# Patient Record
Sex: Male | Born: 1960 | Race: Black or African American | Hispanic: No | Marital: Married | Smoking: Former smoker
Health system: Southern US, Community
[De-identification: ages and names within clinical notes are randomized; demographics above are authoritative.]

## PROBLEM LIST (undated history)

## (undated) DIAGNOSIS — M1990 Unspecified osteoarthritis, unspecified site: Secondary | ICD-10-CM

## (undated) DIAGNOSIS — N189 Chronic kidney disease, unspecified: Secondary | ICD-10-CM

## (undated) DIAGNOSIS — N400 Enlarged prostate without lower urinary tract symptoms: Secondary | ICD-10-CM

## (undated) DIAGNOSIS — F32A Depression, unspecified: Secondary | ICD-10-CM

## (undated) DIAGNOSIS — I10 Essential (primary) hypertension: Secondary | ICD-10-CM

## (undated) DIAGNOSIS — F329 Major depressive disorder, single episode, unspecified: Secondary | ICD-10-CM

## (undated) HISTORY — DX: Unspecified osteoarthritis, unspecified site: M19.90

## (undated) HISTORY — DX: Essential (primary) hypertension: I10

## (undated) HISTORY — DX: Depression, unspecified: F32.A

## (undated) HISTORY — DX: Chronic kidney disease, unspecified: N18.9

## (undated) HISTORY — PX: CYST EXCISION: SHX5701

## (undated) HISTORY — PX: AV FISTULA PLACEMENT: SHX1204

## (undated) HISTORY — DX: Major depressive disorder, single episode, unspecified: F32.9

---

## 2000-01-10 ENCOUNTER — Emergency Department (HOSPITAL_COMMUNITY): Admission: EM | Admit: 2000-01-10 | Discharge: 2000-01-10 | Payer: Self-pay | Admitting: Emergency Medicine

## 2009-04-03 ENCOUNTER — Emergency Department: Payer: Self-pay | Admitting: Emergency Medicine

## 2010-11-22 LAB — HEMOGLOBIN A1C: Hgb A1c MFr Bld: 5.5 % (ref 4.0–6.0)

## 2011-12-12 DIAGNOSIS — L738 Other specified follicular disorders: Secondary | ICD-10-CM | POA: Diagnosis not present

## 2011-12-24 DIAGNOSIS — L708 Other acne: Secondary | ICD-10-CM | POA: Diagnosis not present

## 2011-12-24 DIAGNOSIS — L905 Scar conditions and fibrosis of skin: Secondary | ICD-10-CM | POA: Diagnosis not present

## 2012-03-08 LAB — TSH: TSH: 2.19 u[IU]/mL (ref <=5.90)

## 2012-09-02 DIAGNOSIS — M2550 Pain in unspecified joint: Secondary | ICD-10-CM | POA: Diagnosis not present

## 2012-10-22 ENCOUNTER — Ambulatory Visit: Payer: Self-pay | Admitting: Anesthesiology

## 2012-10-22 DIAGNOSIS — Z01810 Encounter for preprocedural cardiovascular examination: Secondary | ICD-10-CM | POA: Diagnosis not present

## 2012-10-22 DIAGNOSIS — Z01812 Encounter for preprocedural laboratory examination: Secondary | ICD-10-CM | POA: Diagnosis not present

## 2012-10-22 DIAGNOSIS — I10 Essential (primary) hypertension: Secondary | ICD-10-CM | POA: Diagnosis not present

## 2012-10-22 LAB — BASIC METABOLIC PANEL
Anion Gap: 4 — ABNORMAL LOW (ref 7–16)
BUN: 12 mg/dL (ref 7–18)
Chloride: 106 mmol/L (ref 98–107)
Creatinine: 1.9 mg/dL — ABNORMAL HIGH (ref 0.60–1.30)
EGFR (African American): 46 — ABNORMAL LOW
EGFR (Non-African Amer.): 40 — ABNORMAL LOW
Glucose: 89 mg/dL (ref 65–99)
Osmolality: 279 (ref 275–301)

## 2012-10-27 ENCOUNTER — Ambulatory Visit: Payer: Self-pay | Admitting: Orthopedic Surgery

## 2012-10-27 DIAGNOSIS — I10 Essential (primary) hypertension: Secondary | ICD-10-CM | POA: Diagnosis not present

## 2012-10-27 DIAGNOSIS — M6740 Ganglion, unspecified site: Secondary | ICD-10-CM | POA: Diagnosis not present

## 2012-10-27 DIAGNOSIS — Z87891 Personal history of nicotine dependence: Secondary | ICD-10-CM | POA: Diagnosis not present

## 2012-10-27 DIAGNOSIS — N289 Disorder of kidney and ureter, unspecified: Secondary | ICD-10-CM | POA: Diagnosis not present

## 2012-10-27 DIAGNOSIS — F319 Bipolar disorder, unspecified: Secondary | ICD-10-CM | POA: Diagnosis not present

## 2012-10-27 DIAGNOSIS — D1610 Benign neoplasm of short bones of unspecified upper limb: Secondary | ICD-10-CM | POA: Diagnosis not present

## 2012-10-27 DIAGNOSIS — Z79899 Other long term (current) drug therapy: Secondary | ICD-10-CM | POA: Diagnosis not present

## 2012-10-27 DIAGNOSIS — M6749 Ganglion, multiple sites: Secondary | ICD-10-CM | POA: Diagnosis not present

## 2012-10-27 DIAGNOSIS — M19019 Primary osteoarthritis, unspecified shoulder: Secondary | ICD-10-CM | POA: Diagnosis not present

## 2012-12-03 LAB — LIPID PANEL
Cholesterol: 217 mg/dL — AB (ref 0–200)
HDL: 41 mg/dL (ref 35–70)
LDL CALC: 136 mg/dL
Triglycerides: 201 mg/dL — AB (ref 40–160)

## 2013-04-01 DIAGNOSIS — M6740 Ganglion, unspecified site: Secondary | ICD-10-CM | POA: Diagnosis not present

## 2013-06-18 LAB — BASIC METABOLIC PANEL
BUN: 16 mg/dL (ref 4–21)
Creatinine: 2.1 mg/dL — AB (ref 0.6–1.3)
GLUCOSE: 102 mg/dL
POTASSIUM: 3.3 mmol/L — AB (ref 3.4–5.3)
SODIUM: 140 mmol/L (ref 137–147)

## 2013-08-13 LAB — CBC AND DIFFERENTIAL
HEMATOCRIT: 35 % — AB (ref 41–53)
HEMOGLOBIN: 12.7 g/dL — AB (ref 13.5–17.5)
Platelets: 167 10*3/uL (ref 150–399)
WBC: 7.3 10^3/mL

## 2013-08-13 LAB — BASIC METABOLIC PANEL
BUN: 15 mg/dL (ref 4–21)
Creatinine: 2.2 mg/dL — AB (ref 0.6–1.3)
Glucose: 95 mg/dL
Potassium: 3 mmol/L — AB (ref 3.4–5.3)
Sodium: 138 mmol/L (ref 137–147)

## 2013-08-26 DIAGNOSIS — J309 Allergic rhinitis, unspecified: Secondary | ICD-10-CM | POA: Diagnosis not present

## 2013-10-26 DIAGNOSIS — J019 Acute sinusitis, unspecified: Secondary | ICD-10-CM | POA: Diagnosis not present

## 2013-10-26 DIAGNOSIS — R05 Cough: Secondary | ICD-10-CM | POA: Diagnosis not present

## 2013-10-26 DIAGNOSIS — R059 Cough, unspecified: Secondary | ICD-10-CM | POA: Diagnosis not present

## 2013-11-03 ENCOUNTER — Emergency Department: Payer: Self-pay | Admitting: Emergency Medicine

## 2013-11-03 DIAGNOSIS — J189 Pneumonia, unspecified organism: Secondary | ICD-10-CM | POA: Diagnosis not present

## 2013-11-03 DIAGNOSIS — Z79899 Other long term (current) drug therapy: Secondary | ICD-10-CM | POA: Diagnosis not present

## 2013-11-03 DIAGNOSIS — F319 Bipolar disorder, unspecified: Secondary | ICD-10-CM | POA: Diagnosis not present

## 2013-11-03 DIAGNOSIS — I10 Essential (primary) hypertension: Secondary | ICD-10-CM | POA: Diagnosis not present

## 2013-11-06 HISTORY — PX: CYST EXCISION: SHX5701

## 2014-01-14 LAB — BASIC METABOLIC PANEL
BUN: 20 mg/dL (ref 4–21)
CREATININE: 2.2 mg/dL — AB (ref 0.6–1.3)
GLUCOSE: 67 mg/dL
POTASSIUM: 3.5 mmol/L (ref 3.4–5.3)
Sodium: 141 mmol/L (ref 137–147)

## 2014-01-14 LAB — CBC AND DIFFERENTIAL
HCT: 33 % — AB (ref 41–53)
Hemoglobin: 11.9 g/dL — AB (ref 13.5–17.5)
Platelets: 178 10*3/uL (ref 150–399)
WBC: 7.7 10*3/mL

## 2014-01-31 ENCOUNTER — Encounter: Payer: Self-pay | Admitting: Internal Medicine

## 2014-01-31 ENCOUNTER — Encounter (INDEPENDENT_AMBULATORY_CARE_PROVIDER_SITE_OTHER): Payer: Self-pay

## 2014-01-31 ENCOUNTER — Ambulatory Visit (INDEPENDENT_AMBULATORY_CARE_PROVIDER_SITE_OTHER): Payer: Medicare Other | Admitting: Internal Medicine

## 2014-01-31 VITALS — BP 126/76 | HR 52 | Temp 98.3°F | Resp 16 | Ht 67.75 in | Wt 196.2 lb

## 2014-01-31 DIAGNOSIS — I10 Essential (primary) hypertension: Secondary | ICD-10-CM | POA: Diagnosis not present

## 2014-01-31 DIAGNOSIS — F3289 Other specified depressive episodes: Secondary | ICD-10-CM

## 2014-01-31 DIAGNOSIS — M1990 Unspecified osteoarthritis, unspecified site: Secondary | ICD-10-CM

## 2014-01-31 DIAGNOSIS — M129 Arthropathy, unspecified: Secondary | ICD-10-CM

## 2014-01-31 DIAGNOSIS — F32A Depression, unspecified: Secondary | ICD-10-CM

## 2014-01-31 DIAGNOSIS — F329 Major depressive disorder, single episode, unspecified: Secondary | ICD-10-CM | POA: Diagnosis not present

## 2014-01-31 NOTE — Progress Notes (Signed)
Pre visit review using our clinic review tool, if applicable. No additional management support is needed unless otherwise documented below in the visit note. 

## 2014-02-01 ENCOUNTER — Telehealth: Payer: Self-pay | Admitting: Internal Medicine

## 2014-02-01 NOTE — Telephone Encounter (Signed)
The patient left a message to add fish oil to his medication list.

## 2014-02-02 NOTE — Telephone Encounter (Signed)
Updated med list

## 2014-02-07 ENCOUNTER — Encounter: Payer: Self-pay | Admitting: Internal Medicine

## 2014-02-07 DIAGNOSIS — F329 Major depressive disorder, single episode, unspecified: Secondary | ICD-10-CM | POA: Insufficient documentation

## 2014-02-07 DIAGNOSIS — F32A Depression, unspecified: Secondary | ICD-10-CM | POA: Insufficient documentation

## 2014-02-07 DIAGNOSIS — M1990 Unspecified osteoarthritis, unspecified site: Secondary | ICD-10-CM | POA: Insufficient documentation

## 2014-02-07 DIAGNOSIS — I10 Essential (primary) hypertension: Secondary | ICD-10-CM | POA: Insufficient documentation

## 2014-02-07 DIAGNOSIS — F320 Major depressive disorder, single episode, mild: Secondary | ICD-10-CM | POA: Insufficient documentation

## 2014-02-07 NOTE — Assessment & Plan Note (Signed)
Sees psychiatry.  On Lithium and olanzapine.  Doing well on this regimen.  Follow.

## 2014-02-07 NOTE — Assessment & Plan Note (Signed)
Blood pressure under good control on current regimen.  Obtain labs from outside.  Continue same medication regimen.  Follow.

## 2014-02-07 NOTE — Progress Notes (Signed)
   Subjective:    Patient ID: Donald Small, male    DOB: 1961-08-18, 53 y.o.   MRN: [id number]  HPI 53 year old male with past history of arthritis, depression and hypertension who comes in today to follow up on these issues as well as to establish care.  Is followed at the [place].  Is seen there every six months.  He is also followed by psychiatry for his depression.  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  Also hospitalized in 2001.  Is on Lithium and olanzapine now and doing well.  Takes his medication regularly.  States blood pressure is controlled on his current regimen.  Denies and cardiac symptoms with increased activity or exertion.  No sob.  Eating and drinking well.  Bowels stable.  Overall he feels he is doing well.     Past Medical History  Diagnosis Date  . Arthritis   . Depression   . Hypertension     Outpatient Encounter Prescriptions as of 01/31/2014  Medication Sig  . amLODipine (NORVASC) 10 MG tablet Take 10 mg by mouth daily.  . carvedilol (COREG) 12.5 MG tablet Take 12.5 mg by mouth 2 (two) times daily with a meal.  . cholecalciferol (VITAMIN D) 1000 UNITS tablet Take 1,000 Units by mouth daily.  . hydrochlorothiazide (HYDRODIURIL) 25 MG tablet Take 25 mg by mouth every other day.  . lithium carbonate 300 MG capsule Take 900 mg by mouth at bedtime.  Marland Kitchen OLANZapine (ZYPREXA) 10 MG tablet Take 10 mg by mouth at bedtime.    Review of Systems Patient denies any headache, lightheadedness or dizziness.  No sinus or allergy symptoms.  No chest pain, tightness or palpitations.  No increased shortness of breath, cough or congestion.  No nausea or vomiting.  No acid reflux.  No abdominal pain or cramping.  No bowel change, such as diarrhea, constipation, BRBPR or melana.  No urine change.  On lithium and olanzapine.  Doing well on this regimen.  Overall he feels things are stable.        Objective:   Physical Exam Filed Vitals:   01/31/14 1152  BP: 126/76  Pulse: 52  Temp: 98.3 F  (36.8 C)  Resp: 16   Blood pressure recheck:  124/80, pulse 28-77  53 year old male in no acute distress.  HEENT:  Nares - clear.  Oropharynx - without lesions. NECK:  Supple.  Nontender.  No audible carotid bruit.  HEART:  Appears to be regular.   LUNGS:  No crackles or wheezing audible.  Respirations even and unlabored.   RADIAL PULSE:  Equal bilaterally.  ABDOMEN:  Soft.  Nontender.  Bowel sounds present and normal.  No audible abdominal bruit.    EXTREMITIES:  No increased edema present.  DP pulses palpable and equal bilaterally.         Assessment & Plan:  HEALTH MAINTENANCE.  Need to obtain records from the [place].  Schedule for a physical next visit.  Had colonoscopy at [facility].  Obtain results.    I spent 45 minutes with the patient and more than 50% of the time was spent in consultation regarding the above.

## 2014-02-07 NOTE — Assessment & Plan Note (Signed)
Stable

## 2014-02-15 ENCOUNTER — Encounter: Payer: Self-pay | Admitting: Internal Medicine

## 2014-02-15 DIAGNOSIS — K648 Other hemorrhoids: Secondary | ICD-10-CM | POA: Insufficient documentation

## 2014-03-24 ENCOUNTER — Other Ambulatory Visit: Payer: Self-pay

## 2014-03-27 ENCOUNTER — Encounter: Payer: Self-pay | Admitting: Internal Medicine

## 2014-03-27 DIAGNOSIS — N184 Chronic kidney disease, stage 4 (severe): Secondary | ICD-10-CM | POA: Insufficient documentation

## 2014-03-27 DIAGNOSIS — N185 Chronic kidney disease, stage 5: Secondary | ICD-10-CM | POA: Insufficient documentation

## 2014-03-27 DIAGNOSIS — N183 Chronic kidney disease, stage 3 (moderate): Secondary | ICD-10-CM

## 2014-03-27 DIAGNOSIS — N289 Disorder of kidney and ureter, unspecified: Secondary | ICD-10-CM | POA: Insufficient documentation

## 2014-03-27 DIAGNOSIS — R9389 Abnormal findings on diagnostic imaging of other specified body structures: Secondary | ICD-10-CM | POA: Insufficient documentation

## 2014-07-08 ENCOUNTER — Other Ambulatory Visit: Payer: Self-pay

## 2014-08-05 ENCOUNTER — Encounter: Payer: Self-pay | Admitting: Internal Medicine

## 2014-08-05 ENCOUNTER — Ambulatory Visit (INDEPENDENT_AMBULATORY_CARE_PROVIDER_SITE_OTHER): Payer: Medicare Other | Admitting: Internal Medicine

## 2014-08-05 VITALS — BP 130/85 | HR 52 | Temp 97.8°F | Ht 68.0 in | Wt 197.8 lb

## 2014-08-05 DIAGNOSIS — Z125 Encounter for screening for malignant neoplasm of prostate: Secondary | ICD-10-CM

## 2014-08-05 DIAGNOSIS — Z13220 Encounter for screening for lipoid disorders: Secondary | ICD-10-CM

## 2014-08-05 DIAGNOSIS — E7800 Pure hypercholesterolemia, unspecified: Secondary | ICD-10-CM | POA: Insufficient documentation

## 2014-08-05 DIAGNOSIS — F32A Depression, unspecified: Secondary | ICD-10-CM

## 2014-08-05 DIAGNOSIS — D649 Anemia, unspecified: Secondary | ICD-10-CM | POA: Diagnosis not present

## 2014-08-05 DIAGNOSIS — Z1211 Encounter for screening for malignant neoplasm of colon: Secondary | ICD-10-CM

## 2014-08-05 DIAGNOSIS — N289 Disorder of kidney and ureter, unspecified: Secondary | ICD-10-CM | POA: Diagnosis not present

## 2014-08-05 DIAGNOSIS — F329 Major depressive disorder, single episode, unspecified: Secondary | ICD-10-CM

## 2014-08-05 DIAGNOSIS — M1990 Unspecified osteoarthritis, unspecified site: Secondary | ICD-10-CM

## 2014-08-05 DIAGNOSIS — I10 Essential (primary) hypertension: Secondary | ICD-10-CM | POA: Diagnosis not present

## 2014-08-05 DIAGNOSIS — K648 Other hemorrhoids: Secondary | ICD-10-CM

## 2014-08-05 DIAGNOSIS — K625 Hemorrhage of anus and rectum: Secondary | ICD-10-CM

## 2014-08-05 LAB — TSH: TSH: 2.31 u[IU]/mL (ref 0.35–4.50)

## 2014-08-05 LAB — BASIC METABOLIC PANEL
BUN: 23 mg/dL (ref 6–23)
CHLORIDE: 106 meq/L (ref 96–112)
CO2: 19 mEq/L (ref 19–32)
Calcium: 9.6 mg/dL (ref 8.4–10.5)
Creatinine, Ser: 2.3 mg/dL — ABNORMAL HIGH (ref 0.4–1.5)
GFR: 37.72 mL/min — ABNORMAL LOW (ref >60.00)
Glucose, Bld: 93 mg/dL (ref 70–99)
Potassium: 4.3 mEq/L (ref 3.5–5.1)
Sodium: 139 mEq/L (ref 135–145)

## 2014-08-05 LAB — IBC PANEL
IRON: 91 ug/dL (ref 42–165)
SATURATION RATIOS: 29.1 % (ref 20.0–50.0)
Transferrin: 223.4 mg/dL (ref 212.0–360.0)

## 2014-08-05 LAB — CBC WITH DIFFERENTIAL/PLATELET
BASOS PCT: 0.6 % (ref 0.0–3.0)
Basophils Absolute: 0 10*3/uL (ref 0.0–0.1)
EOS PCT: 13.8 % — AB (ref 0.0–5.0)
Eosinophils Absolute: 1.1 10*3/uL — ABNORMAL HIGH (ref 0.0–0.7)
HEMATOCRIT: 37.9 % — AB (ref 39.0–52.0)
Hemoglobin: 12.5 g/dL — ABNORMAL LOW (ref 13.0–17.0)
Lymphocytes Relative: 26.1 % (ref 12.0–46.0)
Lymphs Abs: 2.1 10*3/uL (ref 0.7–4.0)
MCHC: 32.9 g/dL (ref 30.0–36.0)
MCV: 96 fl (ref 78.0–100.0)
MONO ABS: 0.6 10*3/uL (ref 0.1–1.0)
MONOS PCT: 7.1 % (ref 3.0–12.0)
NEUTROS PCT: 52.4 % (ref 43.0–77.0)
Neutro Abs: 4.2 10*3/uL (ref 1.4–7.7)
Platelets: 160 10*3/uL (ref 150.0–400.0)
RBC: 3.95 Mil/uL — AB (ref 4.22–5.81)
RDW: 13 % (ref 11.5–15.5)
WBC: 8 10*3/uL (ref 4.0–10.5)

## 2014-08-05 LAB — HEPATIC FUNCTION PANEL
ALK PHOS: 55 U/L (ref 39–117)
ALT: 37 U/L (ref 0–53)
AST: 45 U/L — ABNORMAL HIGH (ref 0–37)
Albumin: 3.5 g/dL (ref 3.5–5.2)
BILIRUBIN TOTAL: 0.5 mg/dL (ref 0.2–1.2)
Bilirubin, Direct: 0 mg/dL (ref 0.0–0.3)
Total Protein: 8.2 g/dL (ref 6.0–8.3)

## 2014-08-05 LAB — LIPID PANEL
CHOLESTEROL: 202 mg/dL — AB (ref 0–200)
HDL: 28.9 mg/dL — ABNORMAL LOW (ref >39.00)
LDL Cholesterol: 148 mg/dL — ABNORMAL HIGH (ref 0–99)
NonHDL: 173.1
TRIGLYCERIDES: 127 mg/dL (ref 0.0–149.0)
Total CHOL/HDL Ratio: 7
VLDL: 25.4 mg/dL (ref 0.0–40.0)

## 2014-08-05 LAB — FERRITIN: Ferritin: 104 ng/mL (ref 22.0–322.0)

## 2014-08-05 NOTE — Progress Notes (Signed)
Pre visit review using our clinic review tool, if applicable. No additional management support is needed unless otherwise documented below in the visit note. 

## 2014-08-05 NOTE — Progress Notes (Signed)
Subjective:    Patient ID: Donald Small, male    DOB: 1961-02-12, 53 y.o.   MRN: [id number]  HPI 53 year old male with past history of arthritis, depression and hypertension who comes in today to follow up on these issues as well as for a complete physical exam.  Is followed at the [place].  Is seen there every six months.  He is also followed by psychiatry for his depression.  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  Also hospitalized in 2001.  Is on Lithium.   Takes his medication regularly.  Just had the dose decreased.  Seeing psychiatry.  Doing well.  States blood pressure is controlled on his current regimen.  Denies and cardiac symptoms with increased activity or exertion.  No sob.  Eating and drinking well.  Has issues with what he determines are hemorrhoids.  Flare roughly every other month.  Bleeding associated.   Overall he feels he is doing well.     Past Medical History  Diagnosis Date  . Arthritis   . Depression   . Hypertension     Outpatient Encounter Prescriptions as of 08/05/2014  Medication Sig  . aMILoride (MIDAMOR) 5 MG tablet Take 5 mg by mouth daily.  Marland Kitchen amLODipine (NORVASC) 10 MG tablet Take 10 mg by mouth daily.  . carvedilol (COREG) 12.5 MG tablet Take 12.5 mg by mouth 2 (two) times daily with a meal.  . lithium carbonate 300 MG capsule Take 900 mg by mouth at bedtime.  Marland Kitchen OLANZapine (ZYPREXA) 10 MG tablet Take 10 mg by mouth at bedtime.  . Omega-3 Fatty Acids (FISH OIL PO) Take by mouth.  . [DISCONTINUED] cholecalciferol (VITAMIN D) 1000 UNITS tablet Take 1,000 Units by mouth daily.  . [DISCONTINUED] hydrochlorothiazide (HYDRODIURIL) 25 MG tablet Take 25 mg by mouth every other day.    Review of Systems Patient denies any headache, lightheadedness or dizziness.  No sinus or allergy symptoms.  No chest pain, tightness or palpitations.  No increased shortness of breath, cough or congestion.  No nausea or vomiting.  No acid reflux.  No abdominal pain or cramping.  No  bowel change, such as diarrhea, constipation.  Does report the bleeding as outlined.  he relates to hemorrhoids.  No urine change.  On lithium and olanzapine.  Doing well on this regimen.  Overall he feels things are stable.  Dose recently decreased.  HCTZ stopped and started on amlodipine.       Objective:   Physical Exam  Filed Vitals:   08/05/14 0849  BP: 130/85  Pulse: 52  Temp: 97.8 F (36.6 C)   Blood pressure recheck:  112/78, pulse 11  53 year old male in no acute distress.  HEENT:  Nares - clear.  Oropharynx - without lesions. NECK:  Supple.  Nontender.  No audible carotid bruit.  HEART:  Appears to be regular.   LUNGS:  No crackles or wheezing audible.  Respirations even and unlabored.   RADIAL PULSE:  Equal bilaterally.  ABDOMEN:  Soft.  Nontender.  Bowel sounds present and normal.  No audible abdominal bruit.  GU:  Not performed.     RECTAL:  Could not appreciate any palpable prostate nodules.  Heme positive.   EXTREMITIES:  No increased edema present.  DP pulses palpable and equal bilaterally.         Assessment & Plan:  Essential hypertension, benign Blood pressure doing well on the new regimen.  Off HCTZ now.  On amlodipine.  Follow.  Check met b.   Arthritis Stable.    Renal insufficiency Followed by nephrology.  Cr apparently was increasing.  HCTZ was stopped.  Started on amlodipine.  Blood pressure doing well.  Check metabolic panel with next labs.   - Hepatic function panel - Basic metabolic panel   Depression Stable.  Followed by psychiatry.  Lithium dose just adjusted.   - TSH  Anemia, unspecified anemia type - CBC with Differential - Ferritin - IBC panel Rectal bleeding as outlined.  Refer to GI.   Screening cholesterol level Check lipid panel.    Hypercholesterolemia - Lipid panel  Colon cancer screening Overdue colonoscopy.  Has the rectal bleeding as outlined.  He attributes to hemorrhoidal bleeding.   - Ambulatory referral to  Gastroenterology  Rectal bleeding See above.  Has suppositories that he uses for flares.  - Ambulatory referral to Gastroenterology  Prostate cancer screening Performed at the [facility].   Hemorrhoids, internal Needs f/u if persistent problems.    HEALTH MAINTENANCE.  Physical today.  Prostate (PSA) followed at the [place].  Had colonoscopy at [facility].  Obtain results. States has been greater than 10 years.  Refer back to GI for evaluation and f/u colonoscopy.     I spent 25 minutes with the patient and more than 50% of the time was spent in consultation regarding the above.

## 2014-08-07 DIAGNOSIS — K625 Hemorrhage of anus and rectum: Secondary | ICD-10-CM | POA: Insufficient documentation

## 2014-08-09 ENCOUNTER — Encounter: Payer: Self-pay | Admitting: Internal Medicine

## 2014-08-09 ENCOUNTER — Telehealth: Payer: Self-pay | Admitting: Internal Medicine

## 2014-08-09 DIAGNOSIS — D649 Anemia, unspecified: Secondary | ICD-10-CM

## 2014-08-09 DIAGNOSIS — R7989 Other specified abnormal findings of blood chemistry: Secondary | ICD-10-CM

## 2014-08-09 DIAGNOSIS — R945 Abnormal results of liver function studies: Secondary | ICD-10-CM

## 2014-08-09 NOTE — Telephone Encounter (Signed)
Pt notified of lab results via my chart.  Needs a non fasting lab appt within the next 10 days.  Please schedule and contact him with an appt date and time.  Thanks.

## 2014-08-11 NOTE — Telephone Encounter (Signed)
Unread mychart message mailed to patient 

## 2014-08-22 ENCOUNTER — Other Ambulatory Visit: Payer: Medicare Other

## 2014-09-29 DIAGNOSIS — K625 Hemorrhage of anus and rectum: Secondary | ICD-10-CM | POA: Diagnosis not present

## 2014-10-25 ENCOUNTER — Ambulatory Visit: Payer: Self-pay | Admitting: Gastroenterology

## 2014-10-25 DIAGNOSIS — I10 Essential (primary) hypertension: Secondary | ICD-10-CM | POA: Diagnosis not present

## 2014-10-25 DIAGNOSIS — K5730 Diverticulosis of large intestine without perforation or abscess without bleeding: Secondary | ICD-10-CM | POA: Diagnosis not present

## 2014-10-25 DIAGNOSIS — K625 Hemorrhage of anus and rectum: Secondary | ICD-10-CM | POA: Diagnosis not present

## 2014-10-25 DIAGNOSIS — K5790 Diverticulosis of intestine, part unspecified, without perforation or abscess without bleeding: Secondary | ICD-10-CM | POA: Diagnosis not present

## 2014-10-25 DIAGNOSIS — N289 Disorder of kidney and ureter, unspecified: Secondary | ICD-10-CM | POA: Diagnosis not present

## 2014-10-25 LAB — HM COLONOSCOPY

## 2014-11-30 ENCOUNTER — Encounter: Payer: Self-pay | Admitting: Internal Medicine

## 2015-01-13 NOTE — Op Note (Signed)
PATIENT NAME:  Donald Small, Donald Small MR#:  [id number] DATE OF BIRTH:  Jun 18, 1961  DATE OF PROCEDURE:  10/27/2012  PREOPERATIVE DIAGNOSIS: Left dorsal wrist ganglion.   POSTOPERATIVE DIAGNOSIS: Left dorsal wrist ganglion.   PROCEDURE: Excision of dorsal wrist ganglion from carpometacarpal joint along with bony spur, left hand.   ANESTHESIA: General.   SURGEON: Laurene Footman, MD   DESCRIPTION OF PROCEDURE: The patient was brought to the operating room, and after adequate anesthesia was obtained, the left arm was prepped and draped in the usual sterile fashion with a tourniquet applied to the upper arm. After appropriate patient identification and timeout procedures were carried out, the tourniquet was raised to 250 mmHg. A transverse incision was made over the area of the mass, which was at the level of the [facility] joints of the third and fourth CMC joints. The skin and subcutaneous tissue were spread, and the extensor tendons were noted to have moderate tenosynovitis, which was partially debrided. Getting down to the dorsum of the base of the third and fourth metacarpals, there was a fluid-filled cyst at the base of both of these that was fairly small, with prominent bony spur adjacent to this off the base of the metacarpals. A small rongeur was used to debride the cyst, and a small osteotome used to smooth the dorsal aspect of the metacarpal to remove the bony prominence. This was carried out at the base of both of these metacarpals. On palpation, there was no further mass noted, and rongeur was used to smooth the edges of the bone. The wound was thoroughly irrigated and closed with simple interrupted 5-0 nylon skin suture. Then, 5 mL of 0.5% Sensorcaine was infiltrated for postoperative analgesia. A sterile dressing with Xeroform, 4 x 4's, Webril and a volar splint were applied to hold the wrist in neutral.   TOURNIQUET TIME: 22 minutes.  COMPLICATIONS: No complications.   SPECIMEN: No specimen.     ____________________________ Laurene Footman, MD mjm:OSi D: 10/27/2012 21:56:44 ET T: 10/28/2012 07:19:03 ET JOB#: [id number]  cc: Laurene Footman, MD, <Dictator> Laurene Footman MD ELECTRONICALLY SIGNED 10/28/2012 8:07

## 2015-02-03 ENCOUNTER — Encounter: Payer: Self-pay | Admitting: Internal Medicine

## 2015-02-03 ENCOUNTER — Ambulatory Visit (INDEPENDENT_AMBULATORY_CARE_PROVIDER_SITE_OTHER): Payer: Medicare Other | Admitting: Internal Medicine

## 2015-02-03 VITALS — BP 120/80 | HR 55 | Temp 98.2°F | Ht 68.0 in | Wt 197.4 lb

## 2015-02-03 DIAGNOSIS — D649 Anemia, unspecified: Secondary | ICD-10-CM | POA: Diagnosis not present

## 2015-02-03 DIAGNOSIS — R9389 Abnormal findings on diagnostic imaging of other specified body structures: Secondary | ICD-10-CM

## 2015-02-03 DIAGNOSIS — I10 Essential (primary) hypertension: Secondary | ICD-10-CM | POA: Diagnosis not present

## 2015-02-03 DIAGNOSIS — R945 Abnormal results of liver function studies: Secondary | ICD-10-CM

## 2015-02-03 DIAGNOSIS — E7800 Pure hypercholesterolemia, unspecified: Secondary | ICD-10-CM

## 2015-02-03 DIAGNOSIS — Z0000 Encounter for general adult medical examination without abnormal findings: Secondary | ICD-10-CM

## 2015-02-03 DIAGNOSIS — N289 Disorder of kidney and ureter, unspecified: Secondary | ICD-10-CM

## 2015-02-03 DIAGNOSIS — F329 Major depressive disorder, single episode, unspecified: Secondary | ICD-10-CM

## 2015-02-03 DIAGNOSIS — R7989 Other specified abnormal findings of blood chemistry: Secondary | ICD-10-CM

## 2015-02-03 DIAGNOSIS — R938 Abnormal findings on diagnostic imaging of other specified body structures: Secondary | ICD-10-CM

## 2015-02-03 DIAGNOSIS — F32A Depression, unspecified: Secondary | ICD-10-CM

## 2015-02-03 DIAGNOSIS — M1990 Unspecified osteoarthritis, unspecified site: Secondary | ICD-10-CM

## 2015-02-03 LAB — CBC WITH DIFFERENTIAL/PLATELET
BASOS ABS: 0 10*3/uL (ref 0.0–0.1)
BASOS PCT: 0.3 % (ref 0.0–3.0)
EOS ABS: 0.5 10*3/uL (ref 0.0–0.7)
EOS PCT: 7.9 % — AB (ref 0.0–5.0)
HCT: 36.7 % — ABNORMAL LOW (ref 39.0–52.0)
HEMOGLOBIN: 12.5 g/dL — AB (ref 13.0–17.0)
LYMPHS PCT: 20.8 % (ref 12.0–46.0)
Lymphs Abs: 1.4 10*3/uL (ref 0.7–4.0)
MCHC: 34 g/dL (ref 30.0–36.0)
MCV: 92.2 fl (ref 78.0–100.0)
Monocytes Absolute: 0.5 10*3/uL (ref 0.1–1.0)
Monocytes Relative: 7 % (ref 3.0–12.0)
NEUTROS PCT: 64 % (ref 43.0–77.0)
Neutro Abs: 4.4 10*3/uL (ref 1.4–7.7)
PLATELETS: 193 10*3/uL (ref 150.0–400.0)
RBC: 3.98 Mil/uL — AB (ref 4.22–5.81)
RDW: 12.3 % (ref 11.5–15.5)
WBC: 6.9 10*3/uL (ref 4.0–10.5)

## 2015-02-03 LAB — LIPID PANEL
Cholesterol: 184 mg/dL (ref 0–200)
HDL: 35 mg/dL — ABNORMAL LOW (ref >39.00)
LDL Cholesterol: 121 mg/dL — ABNORMAL HIGH (ref 0–99)
NonHDL: 149
TRIGLYCERIDES: 138 mg/dL (ref 0.0–149.0)
Total CHOL/HDL Ratio: 5
VLDL: 27.6 mg/dL (ref 0.0–40.0)

## 2015-02-03 LAB — BASIC METABOLIC PANEL
BUN: 29 mg/dL — AB (ref 6–23)
CHLORIDE: 104 meq/L (ref 96–112)
CO2: 26 mEq/L (ref 19–32)
Calcium: 9.2 mg/dL (ref 8.4–10.5)
Creatinine, Ser: 2.09 mg/dL — ABNORMAL HIGH (ref 0.40–1.50)
GFR: 42.68 mL/min — AB (ref >60.00)
GLUCOSE: 98 mg/dL (ref 70–99)
POTASSIUM: 4.2 meq/L (ref 3.5–5.1)
Sodium: 134 mEq/L — ABNORMAL LOW (ref 135–145)

## 2015-02-03 LAB — HEPATIC FUNCTION PANEL
ALT: 95 U/L — ABNORMAL HIGH (ref 0–53)
AST: 90 U/L — AB (ref 0–37)
Albumin: 3.7 g/dL (ref 3.5–5.2)
Alkaline Phosphatase: 51 U/L (ref 39–117)
Bilirubin, Direct: 0.1 mg/dL (ref 0.0–0.3)
TOTAL PROTEIN: 7.6 g/dL (ref 6.0–8.3)
Total Bilirubin: 0.4 mg/dL (ref 0.2–1.2)

## 2015-02-03 LAB — FERRITIN: Ferritin: 72.4 ng/mL (ref 22.0–322.0)

## 2015-02-03 NOTE — Progress Notes (Signed)
Patient ID: Donald Small, male   DOB: January 10, 1961, 54 y.o.   MRN: [id number]   Subjective:    Patient ID: Donald Small, male    DOB: August 24, 1961, 54 y.o.   MRN: [id number]  HPI  Patient here for a scheduled follow up.  Doing well.  Trying to stay active.  Is followed at the [place] as well.   Sees psychiatry.  Labs are followed at the [place].  No cardiac symptoms with increased activity or exertion.  Breathing stable.  Eating and drinking well.  Bowels stable.    Past Medical History  Diagnosis Date  . Arthritis   . Depression   . Hypertension     Current Outpatient Prescriptions on File Prior to Visit  Medication Sig Dispense Refill  . aMILoride (MIDAMOR) 5 MG tablet Take 5 mg by mouth daily.    Marland Kitchen amLODipine (NORVASC) 10 MG tablet Take 10 mg by mouth daily.    . carvedilol (COREG) 12.5 MG tablet Take 12.5 mg by mouth 2 (two) times daily with a meal.    . lithium carbonate 300 MG capsule Take 900 mg by mouth at bedtime.    Marland Kitchen OLANZapine (ZYPREXA) 10 MG tablet Take 10 mg by mouth at bedtime.    . Omega-3 Fatty Acids (FISH OIL PO) Take by mouth.     No current facility-administered medications on file prior to visit.    Review of Systems  Constitutional: Negative for appetite change and unexpected weight change.  HENT: Negative for congestion and sinus pressure.   Respiratory: Negative for cough, chest tightness and shortness of breath.   Cardiovascular: Negative for chest pain, palpitations and leg swelling.  Gastrointestinal: Negative for nausea, vomiting, abdominal pain and diarrhea.  Musculoskeletal: Negative for back pain and joint swelling.  Skin: Negative for color change and rash.  Neurological: Negative for dizziness, light-headedness and numbness.       Objective:    Physical Exam  Constitutional: He appears well-developed and well-nourished. No distress.  HENT:  Nose: Nose normal.  Mouth/Throat: Oropharynx is clear and moist.  Neck: Neck supple. No  thyromegaly present.  Cardiovascular: Normal rate and regular rhythm.   Pulmonary/Chest: Effort normal and breath sounds normal. No respiratory distress.  Abdominal: Soft. Bowel sounds are normal. There is no tenderness.  Musculoskeletal: He exhibits no edema.  Lymphadenopathy:    He has cervical adenopathy.  Skin: No rash noted. No erythema.  Psychiatric: He has a normal mood and affect. His behavior is normal.    BP 120/80 mmHg  Pulse 55  Temp(Src) 98.2 F (36.8 C) (Oral)  Ht 5\' 8"  (1.727 m)  Wt 197 lb 6 oz (89.529 kg)  BMI 30.02 kg/m2  SpO2 98% Wt Readings from Last 3 Encounters:  02/03/15 197 lb 6 oz (89.529 kg)  08/05/14 197 lb 12 oz (89.699 kg)  01/31/14 196 lb 4 oz (89.018 kg)     Lab Results  Component Value Date   WBC 6.9 02/03/2015   HGB 12.5* 02/03/2015   HCT 36.7* 02/03/2015   PLT 193.0 02/03/2015   GLUCOSE 98 02/03/2015   CHOL 184 02/03/2015   TRIG 138.0 02/03/2015   HDL 35.00* 02/03/2015   LDLCALC 121* 02/03/2015   ALT 95* 02/03/2015   AST 90* 02/03/2015   NA 134* 02/03/2015   K 4.2 02/03/2015   CL 104 02/03/2015   CREATININE 2.09* 02/03/2015   BUN 29* 02/03/2015   CO2 26 02/03/2015   TSH 2.31 08/05/2014   HGBA1C 5.5  11/22/2010       Assessment & Plan:   Problem List Items Addressed This Visit    Abnormal CT of the chest    CT chest as outlined.  Followed at [facility].  Need to confirm if had performed.        Abnormal liver function tests    Found on last labs.  Recheck liver panel today.        Anemia    Recheck cbc today.  Just had colonoscopy 10/25/14.        Relevant Orders   CBC with Differential/Platelet (Completed)   Ferritin (Completed)   Arthritis    Stable.       Depression    Sees psychiatry.  On lithium and zyprexa.  States doing well on this regimen       Essential hypertension, benign - Primary    Blood pressure doing well.  Same medication regimen.  Follow pressures.  Follow metabolic panel.       Relevant Orders    Basic metabolic panel (Completed)   Health care maintenance    Prostate checks are followed at the [place].  Just had colonoscopy 10/25/14.        Hypercholesterolemia    Low cholesterol diet and exercise.  Follow lipid panel.        Relevant Orders   Lipid panel (Completed)   Hepatic function panel   Renal insufficiency    Cr has been around 2-2.2 per records.  Recheck metabolic panel.  Being followed at [place].        Other Visit Diagnoses    Abnormal liver function test            Einar Pheasant, MD

## 2015-02-03 NOTE — Progress Notes (Signed)
Pre visit review using our clinic review tool, if applicable. No additional management support is needed unless otherwise documented below in the visit note. 

## 2015-02-06 ENCOUNTER — Encounter: Payer: Self-pay | Admitting: Internal Medicine

## 2015-02-06 ENCOUNTER — Encounter: Payer: Self-pay | Admitting: *Deleted

## 2015-02-06 DIAGNOSIS — R945 Abnormal results of liver function studies: Secondary | ICD-10-CM | POA: Insufficient documentation

## 2015-02-06 DIAGNOSIS — R7989 Other specified abnormal findings of blood chemistry: Secondary | ICD-10-CM | POA: Insufficient documentation

## 2015-02-06 DIAGNOSIS — Z0000 Encounter for general adult medical examination without abnormal findings: Secondary | ICD-10-CM | POA: Insufficient documentation

## 2015-02-06 NOTE — Assessment & Plan Note (Signed)
Cr has been around 2-2.2 per records.  Recheck metabolic panel.  Being followed at [place].

## 2015-02-06 NOTE — Assessment & Plan Note (Signed)
Stable

## 2015-02-06 NOTE — Assessment & Plan Note (Signed)
Found on last labs.  Recheck liver panel today.

## 2015-02-06 NOTE — Assessment & Plan Note (Signed)
Sees psychiatry.  On lithium and zyprexa.  States doing well on this regimen

## 2015-02-06 NOTE — Assessment & Plan Note (Signed)
Low cholesterol diet and exercise.  Follow lipid panel.   

## 2015-02-06 NOTE — Assessment & Plan Note (Signed)
Recheck cbc today.  Just had colonoscopy 10/25/14.

## 2015-02-06 NOTE — Assessment & Plan Note (Signed)
Blood pressure doing well.  Same medication regimen.  Follow pressures.  Follow metabolic panel.   

## 2015-02-06 NOTE — Assessment & Plan Note (Signed)
Prostate checks are followed at the [place].  Just had colonoscopy 10/25/14.

## 2015-02-06 NOTE — Assessment & Plan Note (Signed)
CT chest as outlined.  Followed at [facility].  Need to confirm if had performed.

## 2015-02-08 NOTE — Telephone Encounter (Signed)
Pt notified of results via telephone (see result note)

## 2015-02-16 ENCOUNTER — Encounter: Payer: Self-pay | Admitting: Internal Medicine

## 2015-02-17 NOTE — Telephone Encounter (Signed)
Labs sent per request via Epic.

## 2015-02-21 NOTE — Telephone Encounter (Signed)
Unread mychart message mailed to patient 

## 2015-02-27 ENCOUNTER — Encounter: Payer: Self-pay | Admitting: *Deleted

## 2015-02-27 ENCOUNTER — Telehealth: Payer: Self-pay | Admitting: *Deleted

## 2015-02-27 NOTE — Telephone Encounter (Signed)
-----   Message from Einar Pheasant, MD sent at 02/26/2015  9:17 AM EDT ----- Regarding: FW: u/s Please notify pt to let me know if he has any problem scheduling the ultrasound.    Dr Nicki Reaper ----- Message -----    From: Eustace Pen    Sent: 02/24/2015   4:12 PM      To: Einar Pheasant, MD Subject: u/s                                            Donald Small called and wanted Dr. Nicki Reaper to know that he wants the [facility] to order and schedule his u/s that you had wanted him to have.

## 2015-02-27 NOTE — Telephone Encounter (Signed)
Sent a f/u mychart message re: Ultrasound

## 2015-03-02 NOTE — Telephone Encounter (Signed)
Unread mychart message mailed to patient 

## 2015-03-03 ENCOUNTER — Ambulatory Visit (INDEPENDENT_AMBULATORY_CARE_PROVIDER_SITE_OTHER): Payer: Medicare Other | Admitting: Internal Medicine

## 2015-03-03 ENCOUNTER — Inpatient Hospital Stay
Admission: EM | Admit: 2015-03-03 | Discharge: 2015-03-05 | DRG: 558 | Disposition: A | Discharge status: Home / Self Care | Payer: Medicare Other | Attending: Internal Medicine | Admitting: Internal Medicine

## 2015-03-03 ENCOUNTER — Encounter: Payer: Self-pay | Admitting: Emergency Medicine

## 2015-03-03 ENCOUNTER — Encounter: Payer: Self-pay | Admitting: Internal Medicine

## 2015-03-03 VITALS — BP 110/76 | HR 58 | Temp 98.3°F | Resp 14 | Ht 67.0 in | Wt 193.2 lb

## 2015-03-03 DIAGNOSIS — I10 Essential (primary) hypertension: Secondary | ICD-10-CM

## 2015-03-03 DIAGNOSIS — R7989 Other specified abnormal findings of blood chemistry: Secondary | ICD-10-CM | POA: Diagnosis present

## 2015-03-03 DIAGNOSIS — E860 Dehydration: Secondary | ICD-10-CM | POA: Diagnosis present

## 2015-03-03 DIAGNOSIS — F319 Bipolar disorder, unspecified: Secondary | ICD-10-CM | POA: Diagnosis present

## 2015-03-03 DIAGNOSIS — M6282 Rhabdomyolysis: Secondary | ICD-10-CM | POA: Diagnosis not present

## 2015-03-03 DIAGNOSIS — Z87891 Personal history of nicotine dependence: Secondary | ICD-10-CM

## 2015-03-03 DIAGNOSIS — Z79899 Other long term (current) drug therapy: Secondary | ICD-10-CM

## 2015-03-03 DIAGNOSIS — N289 Disorder of kidney and ureter, unspecified: Secondary | ICD-10-CM

## 2015-03-03 DIAGNOSIS — N400 Enlarged prostate without lower urinary tract symptoms: Secondary | ICD-10-CM | POA: Diagnosis present

## 2015-03-03 DIAGNOSIS — M791 Myalgia, unspecified site: Secondary | ICD-10-CM | POA: Insufficient documentation

## 2015-03-03 DIAGNOSIS — Z833 Family history of diabetes mellitus: Secondary | ICD-10-CM

## 2015-03-03 DIAGNOSIS — Z8042 Family history of malignant neoplasm of prostate: Secondary | ICD-10-CM

## 2015-03-03 DIAGNOSIS — M1990 Unspecified osteoarthritis, unspecified site: Secondary | ICD-10-CM | POA: Diagnosis not present

## 2015-03-03 DIAGNOSIS — Z8249 Family history of ischemic heart disease and other diseases of the circulatory system: Secondary | ICD-10-CM

## 2015-03-03 DIAGNOSIS — I129 Hypertensive chronic kidney disease with stage 1 through stage 4 chronic kidney disease, or unspecified chronic kidney disease: Secondary | ICD-10-CM | POA: Diagnosis not present

## 2015-03-03 DIAGNOSIS — R945 Abnormal results of liver function studies: Secondary | ICD-10-CM

## 2015-03-03 DIAGNOSIS — N179 Acute kidney failure, unspecified: Secondary | ICD-10-CM

## 2015-03-03 DIAGNOSIS — N183 Chronic kidney disease, stage 3 (moderate): Secondary | ICD-10-CM | POA: Diagnosis present

## 2015-03-03 HISTORY — DX: Benign prostatic hyperplasia without lower urinary tract symptoms: N40.0

## 2015-03-03 LAB — BASIC METABOLIC PANEL
ANION GAP: 9 (ref 5–15)
BUN: 23 mg/dL (ref 6–23)
BUN: 25 mg/dL — ABNORMAL HIGH (ref 6–20)
CHLORIDE: 103 mmol/L (ref 101–111)
CO2: 28 mEq/L (ref 19–32)
CO2: 24 mmol/L (ref 22–32)
CREATININE: 1.84 mg/dL — AB (ref 0.40–1.50)
Calcium: 9.4 mg/dL (ref 8.4–10.5)
Calcium: 8.9 mg/dL (ref 8.9–10.3)
Chloride: 103 mEq/L (ref 96–112)
Creatinine, Ser: 1.99 mg/dL — ABNORMAL HIGH (ref 0.61–1.24)
GFR calc non Af Amer: 36 mL/min — ABNORMAL LOW (ref >60)
GFR, EST AFRICAN AMERICAN: 42 mL/min — AB (ref >60)
GFR: 49.42 mL/min — ABNORMAL LOW (ref >60.00)
GLUCOSE: 80 mg/dL (ref 70–99)
Glucose, Bld: 92 mg/dL (ref 65–99)
Potassium: 4.6 mEq/L (ref 3.5–5.1)
Potassium: 4 mmol/L (ref 3.5–5.1)
SODIUM: 136 mmol/L (ref 135–145)
Sodium: 136 mEq/L (ref 135–145)

## 2015-03-03 LAB — HEPATIC FUNCTION PANEL
ALBUMIN: 3.8 g/dL (ref 3.5–5.2)
ALK PHOS: 55 U/L (ref 39–117)
ALT: 149 U/L — AB (ref 0–53)
AST: 137 U/L — AB (ref 0–37)
Bilirubin, Direct: 0.1 mg/dL (ref 0.0–0.3)
TOTAL PROTEIN: 7.7 g/dL (ref 6.0–8.3)
Total Bilirubin: 0.4 mg/dL (ref 0.2–1.2)

## 2015-03-03 LAB — CBC
HCT: 37.4 % — ABNORMAL LOW (ref 40.0–52.0)
Hemoglobin: 12.5 g/dL — ABNORMAL LOW (ref 13.0–18.0)
MCH: 31.5 pg (ref 26.0–34.0)
MCHC: 33.3 g/dL (ref 32.0–36.0)
MCV: 94.4 fL (ref 80.0–100.0)
PLATELETS: 202 10*3/uL (ref 150–440)
RBC: 3.96 MIL/uL — ABNORMAL LOW (ref 4.40–5.90)
RDW: 12.3 % (ref 11.5–14.5)
WBC: 9.6 10*3/uL (ref 3.8–10.6)

## 2015-03-03 LAB — CK: Total CK: 2535 U/L — ABNORMAL HIGH (ref 7–232)

## 2015-03-03 LAB — SEDIMENTATION RATE: Sed Rate: 80 mm/hr — ABNORMAL HIGH (ref 0–22)

## 2015-03-03 LAB — TSH: TSH: 2.33 u[IU]/mL (ref 0.35–4.50)

## 2015-03-03 LAB — TROPONIN I: Troponin I: <0.03 ng/mL (ref <0.031)

## 2015-03-03 MED ORDER — SODIUM CHLORIDE 0.9 % IV BOLUS (SEPSIS)
2000.0000 mL | Freq: Once | INTRAVENOUS | Status: AC
  Administered 2015-03-03: 2000 mL via INTRAVENOUS

## 2015-03-03 NOTE — ED Notes (Signed)
Note from office states "With acute progressing muscle aching, pain and weakness. Unclear etiology. Elevated ESR and CK. Hard for him to ambulate. Progressed since last week. To ER for further evaluation and IVFs. Pt in agreement."

## 2015-03-03 NOTE — ED Provider Notes (Signed)
[facility] Emergency Department Provider Note  Time seen: 10:48 PM  I have reviewed the triage vital signs and the nursing notes.   HISTORY  Chief Complaint Dehydration    HPI Donald Small is a 54 y.o. male with a past medical history of depression and hypertension presents the emergency department with weakness, fatigue, muscle aches and pains especially in the lower extremities. The patient called his doctor yesterday and had an appointment for today. His blood work today showed a CK level of 2300 so he was sent to the emergency department for further evaluation and workup. Patient denies any increased strenuous exercise or activity from his baseline. He states his weakness and muscle aches have been ongoing for approximately one week. He does have a history of chronic kidney disease, denies dark urine recently. Patient states his weakness has been getting progressively worse, it is now to the point where he feels his legs will give out at random times. Patient denies any chest pain, shortness of breath, fever. Denies nausea/vomiting. He states he has been drinking plenty of fluids without relief.    Past Medical History  Diagnosis Date  . Arthritis   . Depression   . Hypertension     Patient Active Problem List   Diagnosis Date Noted  . Muscle ache 03/03/2015  . Abnormal liver function tests 02/06/2015  . Health care maintenance 02/06/2015  . Rectal bleeding 08/07/2014  . Anemia 08/05/2014  . Hypercholesterolemia 08/05/2014  . Abnormal CT of the chest 03/27/2014  . Renal insufficiency 03/27/2014  . Hemorrhoids, internal 02/15/2014  . Depression 02/07/2014  . Essential hypertension, benign 02/07/2014  . Arthritis 02/07/2014    Past Surgical History  Procedure Laterality Date  . Cyst excision Left 11/06/13    ganglion cyst removed left hand    Current Outpatient Rx  Name  Route  Sig  Dispense  Refill  . aMILoride (MIDAMOR) 5 MG tablet   Oral   Take 5 mg by mouth daily.         Marland Kitchen amLODipine (NORVASC) 10 MG tablet   Oral   Take 10 mg by mouth daily.         . carvedilol (COREG) 12.5 MG tablet   Oral   Take 12.5 mg by mouth 2 (two) times daily with a meal.         . lithium carbonate 300 MG capsule   Oral   Take 900 mg by mouth at bedtime.         Marland Kitchen OLANZapine (ZYPREXA) 10 MG tablet   Oral   Take 10 mg by mouth at bedtime.         . Omega-3 Fatty Acids (FISH OIL PO)   Oral   Take by mouth.         . tamsulosin (FLOMAX) 0.4 MG CAPS capsule   Oral   Take 0.4 mg by mouth.           Allergies Review of patient's allergies indicates no known allergies.  Family History  Problem Relation Age of Onset  . Hypertension Mother   . Arthritis Father   . Hypertension Father   . Cancer Father     prostate cancer  . Diabetes Brother     Social History History  Substance Use Topics  . Smoking status: Former Smoker -- 2.00 packs/day for 15 years    Types: Cigarettes    Quit date: 09/23/1988  . Smokeless tobacco: Never Used  . Alcohol  Use: No    Review of Systems Constitutional: Negative for fever. Cardiovascular: Negative for chest pain. Respiratory: Negative for shortness of breath. Gastrointestinal: Negative for abdominal pain, vomiting and diarrhea. Genitourinary: Negative for dysuria. Negative for dark urine. Musculoskeletal: Moderate lower extremity pain, upper extremity pain, back pain. Neurological: Negative for headache 10-point ROS otherwise negative.  ____________________________________________   PHYSICAL EXAM:  VITAL SIGNS: ED Triage Vitals  Enc Vitals Group     BP 03/03/15 1845 142/85 mmHg     Pulse Rate 03/03/15 1845 59     Resp 03/03/15 1845 18     Temp 03/03/15 1845 98.7 F (37.1 C)     Temp Source 03/03/15 1845 Oral     SpO2 03/03/15 1845 100 %     Weight 03/03/15 1845 193 lb (87.544 kg)     Height 03/03/15 1845 5\' 7"  (1.702 m)     Head Cir --      Peak Flow  --      Pain Score 03/03/15 1846 8     Pain Loc --      Pain Edu? --      Excl. in [place]? --     Constitutional: Alert and oriented. Well appearing and in no distress. ENT   Head: Normocephalic and atraumatic   Mouth/Throat: Dry mucous membranes Cardiovascular: Normal rate, regular rhythm. No murmurs Respiratory: Normal respiratory effort without tachypnea nor retractions. Breath sounds are clear  Gastrointestinal: Soft and nontender. No distention.   Musculoskeletal: Nontender with normal range of motion in all extremities. Moderate tenderness to palpation of the thighs, mild tenderness palpation of his cast, mild tenderness palpation of his upper extremities. Neurologic:  Normal speech and language. No gross focal neurologic deficits are appreciated.  Skin:  Skin is warm, dry and intact.  Psychiatric: Mood and affect are normal. Speech and behavior are normal.   ____________________________________________   INITIAL IMPRESSION / ASSESSMENT AND PLAN / ED COURSE  Pertinent labs & imaging results that were available during my care of the patient were reviewed by me and considered in my medical decision making (see chart for details).  Patient presents from his primary care physician with a CK of 2300. Patient in rhabdomyolysis. Will treat with IV fluids while awaiting repeat labs. Denies any chest pain or shortness of breath, but we will check a troponin in addition to basic labs and a CK. At this CK remains elevated we will admit for IV hydration given his history of CKD. Patient agreeable to plan. Patient is a [place] patient, but does not wish to be transferred to a [place].   Repeat CK pending, otherwise labs are within normal limits with a normal potassium, creatinine is largely unchanged from baseline. I discussed the patient with hospitalist, if repeat CK is greater than 2000 we will admit, if less than 2000 patient will be discharged home with supportive care. CK currently pending,  patient care signed out to Dr. Corky Downs. ____________________________________________   FINAL CLINICAL IMPRESSION(S) / ED DIAGNOSES  Rhabdomyolysis   Harvest Dark, MD 03/03/15 2336

## 2015-03-03 NOTE — Assessment & Plan Note (Signed)
Has known CKD.  Is followed by nephrology.  Labs returned with elevated CK.  Symptoms progressing over the last week.  Given history of known kidney issues, will refer to ER for further evaluation and IV hydration.

## 2015-03-03 NOTE — ED Notes (Signed)
Soreness and weakness in legs, went to PCP and was sent here for dehydration.  Also c/o generalized aches and pain in all extremities.

## 2015-03-03 NOTE — Assessment & Plan Note (Signed)
Blood pressure doing well.  Same medication regimen.   

## 2015-03-03 NOTE — Assessment & Plan Note (Signed)
Increasing more on today's labs.  Unclear etiology.  Needs ultrasound of liver.

## 2015-03-03 NOTE — Progress Notes (Signed)
Patient ID: Adelaido Nicklaus, male   DOB: September 09, 1961, 54 y.o.   MRN: [id number]   Subjective:    Patient ID: [name], male    DOB: October 21, 1960, 54 y.o.   MRN: [id number]  HPI  Patient here as a work in with concerns regarding bilateral lower extremity pain and some swelling.  Also, bilateral upper extremity aching and muscle soreness.  No joint pain.  States muscles are aching and feel weak.  When he is walking, legs feel as if they are going to give out.  This has really progressed since last week.  Has affected his ability to walk.  No fever.  No rash.  Eating and drinking fine. No nausea or vomiting.  No bowel change.  Reports increased lower extremity swelling.  Is some better today.  No new medications.  Recent liver tests slightly elevated.  Does not drink.  No illicit drugs.     Past Medical History  Diagnosis Date  . Arthritis   . Depression   . Hypertension     Current Outpatient Prescriptions on File Prior to Visit  Medication Sig Dispense Refill  . aMILoride (MIDAMOR) 5 MG tablet Take 5 mg by mouth daily.    Marland Kitchen amLODipine (NORVASC) 10 MG tablet Take 10 mg by mouth daily.    . carvedilol (COREG) 12.5 MG tablet Take 12.5 mg by mouth 2 (two) times daily with a meal.    . lithium carbonate 300 MG capsule Take 900 mg by mouth at bedtime.    Marland Kitchen OLANZapine (ZYPREXA) 10 MG tablet Take 10 mg by mouth at bedtime.    . Omega-3 Fatty Acids (FISH OIL PO) Take by mouth.     No current facility-administered medications on file prior to visit.    Review of Systems  Constitutional: Negative for fever, chills and unexpected weight change.  HENT: Negative for congestion and sinus pressure.   Respiratory: Negative for cough, chest tightness and shortness of breath.   Cardiovascular: Positive for leg swelling. Negative for chest pain and palpitations.  Gastrointestinal: Negative for nausea, vomiting, abdominal pain and diarrhea.  Musculoskeletal: Negative for joint swelling.       Increased muscle aching, pain and weakness.    Skin: Negative for color change and rash.  Neurological: Negative for dizziness, light-headedness and headaches.  Hematological: Negative for adenopathy. Does not bruise/bleed easily.  Psychiatric/Behavioral: Negative for dysphoric mood and agitation.       Objective:    Physical Exam  Constitutional: He appears well-developed and well-nourished. No distress.  HENT:  Nose: Nose normal.  Mouth/Throat: Oropharynx is clear and moist.  Neck: Neck supple.  Cardiovascular: Normal rate and regular rhythm.   Pulmonary/Chest: Effort normal and breath sounds normal. No respiratory distress.  Abdominal: Soft. Bowel sounds are normal. There is no tenderness.  Musculoskeletal: He exhibits edema and tenderness.  Some bilateral lower extremity edema (mild).  Increased pain with palpation over the muscles in his arms and legs.  No joint pain.   Lymphadenopathy:    He has no cervical adenopathy.  Skin: No rash noted. No erythema.  Psychiatric: He has a normal mood and affect. His behavior is normal.    BP 110/76 mmHg  Pulse 58  Temp(Src) 98.3 F (36.8 C) (Oral)  Resp 14  Ht 5' 7"  (1.702 m)  Wt 193 lb 4 oz (87.658 kg)  BMI 30.26 kg/m2  SpO2 97% Wt Readings from Last 3 Encounters:  03/03/15 193 lb 4 oz (87.658 kg)  02/03/15  197 lb 6 oz (89.529 kg)  08/05/14 197 lb 12 oz (89.699 kg)     Lab Results  Component Value Date   WBC 6.9 02/03/2015   HGB 12.5* 02/03/2015   HCT 36.7* 02/03/2015   PLT 193.0 02/03/2015   GLUCOSE 80 03/03/2015   CHOL 184 02/03/2015   TRIG 138.0 02/03/2015   HDL 35.00* 02/03/2015   LDLCALC 121* 02/03/2015   ALT 149* 03/03/2015   AST 137* 03/03/2015   NA 136 03/03/2015   K 4.6 03/03/2015   CL 103 03/03/2015   CREATININE 1.84* 03/03/2015   BUN 23 03/03/2015   CO2 28 03/03/2015   TSH 2.33 03/03/2015   HGBA1C 5.5 11/22/2010       Assessment & Plan:   Problem List Items Addressed This Visit     Abnormal liver function tests    Increasing more on today's labs.  Unclear etiology.  Needs ultrasound of liver.        Relevant Orders   Hepatic function panel (Completed)   Essential hypertension, benign    Blood pressure doing well.  Same medication regimen.       Muscle ache - Primary    With acute progressing muscle aching, pain and weakness.  Unclear etiology.  Elevated ESR and CK.  Hard for him to ambulate.  Progressed since last week.  To ER for further evaluation and IVFs.  Pt in agreement.        Relevant Orders   Sedimentation rate (Completed)   ANA   TSH (Completed)   CK (Creatine Kinase) (Completed)   Renal insufficiency    Has known CKD.  Is followed by nephrology.  Labs returned with elevated CK.  Symptoms progressing over the last week.  Given history of known kidney issues, will refer to ER for further evaluation and IV hydration.        Relevant Orders   Basic metabolic panel (Completed)     I spent 25 minutes with the patient and more than 50% of the time was spent in consultation regarding the above.     Einar Pheasant, MD

## 2015-03-03 NOTE — Progress Notes (Signed)
Pre-visit discussion using our clinic review tool. No additional management support is needed unless otherwise documented below in the visit note.  

## 2015-03-03 NOTE — Assessment & Plan Note (Signed)
With acute progressing muscle aching, pain and weakness.  Unclear etiology.  Elevated ESR and CK.  Hard for him to ambulate.  Progressed since last week.  To ER for further evaluation and IVFs.  Pt in agreement.

## 2015-03-03 NOTE — ED Notes (Signed)
Pt reports soreness in legs and shoulders bilaterally x 1 week.  PT seen by PCP today and sent to ED for further evaluation and IV fluids.  Pt reports some numbness in legs when sitting for a long time.  Pt reports pain mostly in upper legs.  Pt NAD at this time.

## 2015-03-04 ENCOUNTER — Encounter: Payer: Self-pay | Admitting: Internal Medicine

## 2015-03-04 DIAGNOSIS — I10 Essential (primary) hypertension: Secondary | ICD-10-CM | POA: Diagnosis not present

## 2015-03-04 DIAGNOSIS — R7989 Other specified abnormal findings of blood chemistry: Secondary | ICD-10-CM | POA: Diagnosis present

## 2015-03-04 DIAGNOSIS — F319 Bipolar disorder, unspecified: Secondary | ICD-10-CM | POA: Diagnosis present

## 2015-03-04 DIAGNOSIS — N400 Enlarged prostate without lower urinary tract symptoms: Secondary | ICD-10-CM | POA: Diagnosis present

## 2015-03-04 DIAGNOSIS — Z8042 Family history of malignant neoplasm of prostate: Secondary | ICD-10-CM | POA: Diagnosis not present

## 2015-03-04 DIAGNOSIS — M6282 Rhabdomyolysis: Secondary | ICD-10-CM | POA: Diagnosis present

## 2015-03-04 DIAGNOSIS — N179 Acute kidney failure, unspecified: Secondary | ICD-10-CM | POA: Diagnosis not present

## 2015-03-04 DIAGNOSIS — R740 Nonspecific elevation of levels of transaminase and lactic acid dehydrogenase [LDH]: Secondary | ICD-10-CM | POA: Diagnosis not present

## 2015-03-04 DIAGNOSIS — Z87891 Personal history of nicotine dependence: Secondary | ICD-10-CM | POA: Diagnosis not present

## 2015-03-04 DIAGNOSIS — N183 Chronic kidney disease, stage 3 (moderate): Secondary | ICD-10-CM | POA: Diagnosis present

## 2015-03-04 DIAGNOSIS — Z8249 Family history of ischemic heart disease and other diseases of the circulatory system: Secondary | ICD-10-CM | POA: Diagnosis not present

## 2015-03-04 DIAGNOSIS — E860 Dehydration: Secondary | ICD-10-CM | POA: Diagnosis present

## 2015-03-04 DIAGNOSIS — M1990 Unspecified osteoarthritis, unspecified site: Secondary | ICD-10-CM | POA: Diagnosis present

## 2015-03-04 DIAGNOSIS — Z79899 Other long term (current) drug therapy: Secondary | ICD-10-CM | POA: Diagnosis not present

## 2015-03-04 DIAGNOSIS — I129 Hypertensive chronic kidney disease with stage 1 through stage 4 chronic kidney disease, or unspecified chronic kidney disease: Secondary | ICD-10-CM | POA: Diagnosis present

## 2015-03-04 DIAGNOSIS — Z833 Family history of diabetes mellitus: Secondary | ICD-10-CM | POA: Diagnosis not present

## 2015-03-04 LAB — CBC
HCT: 33.8 % — ABNORMAL LOW (ref 40.0–52.0)
Hemoglobin: 11.2 g/dL — ABNORMAL LOW (ref 13.0–18.0)
MCH: 31.3 pg (ref 26.0–34.0)
MCHC: 33.1 g/dL (ref 32.0–36.0)
MCV: 94.4 fL (ref 80.0–100.0)
Platelets: 173 10*3/uL (ref 150–440)
RBC: 3.58 MIL/uL — ABNORMAL LOW (ref 4.40–5.90)
RDW: 12.3 % (ref 11.5–14.5)
WBC: 8.3 10*3/uL (ref 3.8–10.6)

## 2015-03-04 LAB — URINE DRUG SCREEN, QUALITATIVE (ARMC ONLY)
Amphetamines, Ur Screen: NOT DETECTED
Barbiturates, Ur Screen: NOT DETECTED
Benzodiazepine, Ur Scrn: NOT DETECTED
Cannabinoid 50 Ng, Ur ~~LOC~~: NOT DETECTED
Cocaine Metabolite,Ur ~~LOC~~: NOT DETECTED
MDMA (Ecstasy)Ur Screen: NOT DETECTED
Methadone Scn, Ur: NOT DETECTED
Opiate, Ur Screen: NOT DETECTED
Phencyclidine (PCP) Ur S: NOT DETECTED
Tricyclic, Ur Screen: NOT DETECTED

## 2015-03-04 LAB — POTASSIUM: Potassium: 3.7 mmol/L (ref 3.5–5.1)

## 2015-03-04 LAB — CK: CK TOTAL: 2461 U/L — AB (ref 49–397)

## 2015-03-04 LAB — CREATININE, SERUM
Creatinine, Ser: 1.79 mg/dL — ABNORMAL HIGH (ref 0.61–1.24)
GFR calc Af Amer: 48 mL/min — ABNORMAL LOW (ref >60)
GFR calc non Af Amer: 41 mL/min — ABNORMAL LOW (ref >60)

## 2015-03-04 LAB — TSH: TSH: 3.032 u[IU]/mL (ref 0.350–4.500)

## 2015-03-04 LAB — PHOSPHORUS: Phosphorus: 2.9 mg/dL (ref 2.5–4.6)

## 2015-03-04 LAB — MAGNESIUM: Magnesium: 2 mg/dL (ref 1.7–2.4)

## 2015-03-04 MED ORDER — ONDANSETRON HCL 4 MG PO TABS: 4.0000 mg | ORAL_TABLET | Freq: Four times a day (QID) | ORAL | Status: DC | PRN

## 2015-03-04 MED ORDER — ONDANSETRON HCL 4 MG/2ML IJ SOLN: 4.0000 mg | Freq: Four times a day (QID) | INTRAMUSCULAR | Status: DC | PRN

## 2015-03-04 MED ORDER — DOCUSATE SODIUM 100 MG PO CAPS
100.0000 mg | ORAL_CAPSULE | Freq: Two times a day (BID) | ORAL | Status: DC
  Administered 2015-03-04 (×2): 100 mg via ORAL
  Filled 2015-03-04 (×2): qty 1

## 2015-03-04 MED ORDER — ACETAMINOPHEN 325 MG PO TABS
650.0000 mg | ORAL_TABLET | Freq: Four times a day (QID) | ORAL | Status: DC | PRN
  Administered 2015-03-04: 650 mg via ORAL
  Filled 2015-03-04: qty 2

## 2015-03-04 MED ORDER — OMEGA-3-ACID ETHYL ESTERS 1 G PO CAPS
2.0000 g | ORAL_CAPSULE | Freq: Every day | ORAL | Status: DC
  Administered 2015-03-04: 2 g via ORAL
  Filled 2015-03-04: qty 2

## 2015-03-04 MED ORDER — LITHIUM CARBONATE 300 MG PO CAPS
900.0000 mg | ORAL_CAPSULE | Freq: Every day | ORAL | Status: DC
  Administered 2015-03-04: 900 mg via ORAL
  Filled 2015-03-04 (×2): qty 3

## 2015-03-04 MED ORDER — ACETAMINOPHEN 650 MG RE SUPP: 650.0000 mg | Freq: Four times a day (QID) | RECTAL | Status: DC | PRN

## 2015-03-04 MED ORDER — AMILORIDE HCL 5 MG PO TABS
5.0000 mg | ORAL_TABLET | Freq: Every day | ORAL | Status: DC
  Administered 2015-03-04: 5 mg via ORAL
  Filled 2015-03-04 (×2): qty 1

## 2015-03-04 MED ORDER — TAMSULOSIN HCL 0.4 MG PO CAPS
0.4000 mg | ORAL_CAPSULE | Freq: Every day | ORAL | Status: DC
  Administered 2015-03-04: 0.4 mg via ORAL
  Filled 2015-03-04: qty 1

## 2015-03-04 MED ORDER — SODIUM CHLORIDE 0.9 % IJ SOLN: 3.0000 mL | INTRAMUSCULAR | Status: DC | PRN

## 2015-03-04 MED ORDER — AMLODIPINE BESYLATE 10 MG PO TABS
10.0000 mg | ORAL_TABLET | Freq: Every day | ORAL | Status: DC
  Administered 2015-03-04: 10 mg via ORAL
  Filled 2015-03-04: qty 1

## 2015-03-04 MED ORDER — OLANZAPINE 5 MG PO TABS
10.0000 mg | ORAL_TABLET | Freq: Every day | ORAL | Status: DC
  Administered 2015-03-04: 10 mg via ORAL
  Filled 2015-03-04: qty 2

## 2015-03-04 MED ORDER — SODIUM CHLORIDE 0.9 % IJ SOLN: 3.0000 mL | Freq: Two times a day (BID) | INTRAMUSCULAR | Status: DC

## 2015-03-04 MED ORDER — SODIUM CHLORIDE 0.9 % IV SOLN
INTRAVENOUS | Status: DC
  Administered 2015-03-04 – 2015-03-05 (×4): via INTRAVENOUS

## 2015-03-04 MED ORDER — CARVEDILOL 12.5 MG PO TABS
12.5000 mg | ORAL_TABLET | Freq: Two times a day (BID) | ORAL | Status: DC
  Administered 2015-03-04 (×2): 12.5 mg via ORAL
  Filled 2015-03-04 (×3): qty 1

## 2015-03-04 MED ORDER — MORPHINE SULFATE 4 MG/ML IJ SOLN: 4.0000 mg | INTRAMUSCULAR | Status: DC | PRN

## 2015-03-04 MED ORDER — HEPARIN SODIUM (PORCINE) 5000 UNIT/ML IJ SOLN
5000.0000 [IU] | Freq: Three times a day (TID) | INTRAMUSCULAR | Status: DC
  Administered 2015-03-04 – 2015-03-05 (×4): 5000 [IU] via SUBCUTANEOUS
  Filled 2015-03-04 (×4): qty 1

## 2015-03-04 NOTE — Progress Notes (Signed)
Patient ID: Donald Small, male   DOB: 10-Dec-1960, 54 y.o.   MRN: [id number] [facility] PROGRESS NOTE  PCP: Einar Pheasant, MD  HPI/Subjective:   Objective: Filed Vitals:   03/04/15 0314  BP: 130/78  Pulse: 56  Temp: 97.7 F (36.5 C)  Resp: 16    Intake/Output Summary (Last 24 hours) at 03/04/15 1055 Last data filed at 03/04/15 0830  Gross per 24 hour  Intake    240 ml  Output   2600 ml  Net  -2360 ml   Filed Weights   03/03/15 1845 03/04/15 0314  Weight: 87.544 kg (193 lb) 87.726 kg (193 lb 6.4 oz)    ROS: Review of Systems  Constitutional: Negative for fever and chills.  Eyes: Negative for blurred vision.  Respiratory: Negative for cough and shortness of breath.   Cardiovascular: Negative for chest pain.  Gastrointestinal: Negative for nausea, vomiting, diarrhea and constipation.  Genitourinary: Negative for dysuria.  Musculoskeletal: Positive for myalgias. Negative for joint pain.  Neurological: Negative for dizziness and headaches.   Exam: Physical Exam  HENT:  Nose: No mucosal edema.  Mouth/Throat: No oropharyngeal exudate or posterior oropharyngeal edema.  Eyes: Conjunctivae, EOM and lids are normal. Pupils are equal, round, and reactive to light.  Neck: No JVD present. Carotid bruit is not present. No edema present. No thyroid mass and no thyromegaly present.  Cardiovascular: S1 normal and S2 normal.  Exam reveals no gallop.   No murmur heard. Pulses:      Dorsalis pedis pulses are 2+ on the right side, and 2+ on the left side.  Respiratory: No respiratory distress. He has no wheezes. He has no rhonchi. He has no rales.  GI: Soft. Bowel sounds are normal. There is no tenderness.  Musculoskeletal:       Right ankle: He exhibits no swelling.       Left ankle: He exhibits no swelling.  Lymphadenopathy:    He has no cervical adenopathy.  Neurological: He is alert. No cranial nerve deficit.  Skin: Skin is warm. No rash noted. Nails  show no clubbing.  Psychiatric: He has a normal mood and affect.    Data Reviewed: Basic Metabolic Panel:  Recent Labs Lab 03/03/15 1040 03/03/15 2128 03/04/15 0403  NA 136 136  --   K 4.6 4.0 3.7  CL 103 103  --   CO2 28 24  --   GLUCOSE 80 92  --   BUN 23 25*  --   CREATININE 1.84* 1.99* 1.79*  CALCIUM 9.4 8.9  --   MG  --   --  2.0  PHOS  --   --  2.9   Liver Function Tests:  Recent Labs Lab 03/03/15 1040  AST 137*  ALT 149*  ALKPHOS 55  BILITOT 0.4  PROT 7.7  ALBUMIN 3.8   CBC:  Recent Labs Lab 03/03/15 2128 03/04/15 0403  WBC 9.6 8.3  HGB 12.5* 11.2*  HCT 37.4* 33.8*  MCV 94.4 94.4  PLT 202 173   Cardiac Enzymes:  Recent Labs Lab 03/03/15 1040 03/03/15 2128 03/04/15 0048  CKTOTAL [id number]*  --  2461*  TROPONINI  --  <0.03  --     Scheduled Meds: . aMILoride  5 mg Oral Daily  . amLODipine  10 mg Oral Daily  . carvedilol  12.5 mg Oral BID WC  . docusate sodium  100 mg Oral BID  . heparin  5,000 Units Subcutaneous 3 times per day  . lithium  carbonate  900 mg Oral QHS  . OLANZapine  10 mg Oral QHS  . omega-3 acid ethyl esters  2 g Oral Daily  . tamsulosin  0.4 mg Oral QPC supper   Continuous Infusions: . sodium chloride 125 mL/hr at 03/04/15 0342    Assessment/Plan:  1. Acute non-traumatic rhabdomyolysis; likely from mowing lawns. I did look down the medication list and olanzapine can cause rhabdomyolysis also. Patient states that he's been on this medication for years. I will increase the IV fluid hydration 275 cc per hour and recheck a CPK in the a.m. 2. Acute renal failure-continue IV fluid hydration and check a creatinine in the a.m. 3. Essential hypertension blood pressure acceptable on current medications. 4. Bipolar disorder continue lithium and olanzapine at this point. 5. BPH without urine obstruction- recently started on Flomax. 6. Elevated liver function test likely secondary to the rhabdomyolysis.  Code Status:     Code  Status Orders        Start     Ordered   03/04/15 0253  Full code   Continuous     03/04/15 0252     Family Communication: Family at bedside Disposition Plan: Home once CPK and creatinine improved  Time spent: 32 minutes.  Loletha Grayer  [facility]

## 2015-03-04 NOTE — Progress Notes (Signed)
Pt in NAD, skin warm and dry, VSS, SR per monitor.  Pt denies pain or discomfort.  Soreness of LE's has resolved.  NS infusing at 175 per MD order.  Continued monitoring.  CK redraw in the am.  Possible discharge depending on results.

## 2015-03-04 NOTE — H&P (Addendum)
Donald Small is an 54 y.o. male.   Chief Complaint: Muscle soreness HPI: Patient presents to the hospital at the request of his primary care doctor. He visited his physician 2 days ago due to severe cramping and swelling of his left lower leg. The pain began after push mowing a large yard. The patient had blood work about which his doctor called with results concerning for rhabdomyolysis. In the emergency department the patient's creatinine kinase was found to be greater than 2500 which prompted the emergency department staff to call for admission. The patient denies chest pain, shortness of breath, nausea, vomiting, or diarrhea.  Past Medical History  Diagnosis Date  . Arthritis   . Depression     With psychotic features  . Hypertension   . Benign prostatic hypertrophy     Past Surgical History  Procedure Laterality Date  . Cyst excision Left 11/06/13    ganglion cyst removed left hand    Family History  Problem Relation Age of Onset  . Hypertension Mother   . Arthritis Father   . Hypertension Father   . Cancer Father     prostate cancer  . Diabetes Brother    Social History:  reports that he quit smoking about 26 years ago. His smoking use included Cigarettes. He has a 30 pack-year smoking history. He has never used smokeless tobacco. He reports that he does not drink alcohol or use illicit drugs.  Allergies: No Known Allergies  Prior to Admission medications   Medication Sig Start Date End Date Taking? Authorizing Provider  aMILoride (MIDAMOR) 5 MG tablet Take 5 mg by mouth daily.    Historical Provider, MD  amLODipine (NORVASC) 10 MG tablet Take 10 mg by mouth daily.    Historical Provider, MD  carvedilol (COREG) 12.5 MG tablet Take 12.5 mg by mouth 2 (two) times daily with a meal.    Historical Provider, MD  lithium carbonate 300 MG capsule Take 900 mg by mouth at bedtime.    Historical Provider, MD  OLANZapine (ZYPREXA) 10 MG tablet Take 10 mg by mouth at bedtime.     Historical Provider, MD  Omega-3 Fatty Acids (FISH OIL PO) Take by mouth.    Historical Provider, MD  tamsulosin (FLOMAX) 0.4 MG CAPS capsule Take 0.4 mg by mouth.    Historical Provider, MD    Results for orders placed or performed during the hospital encounter of 03/03/15 (from the past 48 hour(s))  Troponin I     Status: None   Collection Time: 03/03/15  9:28 PM  Result Value Ref Range   Troponin I <0.03 <0.031 ng/mL    Comment:        NO INDICATION OF MYOCARDIAL INJURY.   Basic metabolic panel     Status: Abnormal   Collection Time: 03/03/15  9:28 PM  Result Value Ref Range   Sodium 136 135 - 145 mmol/L   Potassium 4.0 3.5 - 5.1 mmol/L   Chloride 103 101 - 111 mmol/L   CO2 24 22 - 32 mmol/L   Glucose, Bld 92 65 - 99 mg/dL   BUN 25 (H) 6 - 20 mg/dL   Creatinine, Ser 1.99 (H) 0.61 - 1.24 mg/dL   Calcium 8.9 8.9 - 10.3 mg/dL   GFR calc non Af Amer 36 (L) >60 mL/min   GFR calc Af Amer 42 (L) >60 mL/min    Comment: (NOTE) The eGFR has been calculated using the CKD EPI equation. This calculation has not been validated  in all clinical situations. eGFR's persistently <60 mL/min signify possible Chronic Kidney Disease.    Anion gap 9 5 - 15  CBC     Status: Abnormal   Collection Time: 03/03/15  9:28 PM  Result Value Ref Range   WBC 9.6 3.8 - 10.6 K/uL   RBC 3.96 (L) 4.40 - 5.90 MIL/uL   Hemoglobin 12.5 (L) 13.0 - 18.0 g/dL   HCT 37.4 (L) 40.0 - 52.0 %   MCV 94.4 80.0 - 100.0 fL   MCH 31.5 26.0 - 34.0 pg   MCHC 33.3 32.0 - 36.0 g/dL   RDW 12.3 11.5 - 14.5 %   Platelets 202 150 - 440 K/uL  CK     Status: Abnormal   Collection Time: 03/04/15 12:48 AM  Result Value Ref Range   Total CK 2461 (H) 49 - 397 U/L   No results found.  Review of Systems  Constitutional: Negative for fever and chills.  HENT: Negative for sore throat and tinnitus.   Eyes: Negative for blurred vision and redness.  Respiratory: Negative for cough and shortness of breath.   Cardiovascular:  Negative for chest pain, palpitations, orthopnea and PND.  Gastrointestinal: Negative for nausea, vomiting, abdominal pain and diarrhea.  Genitourinary: Negative for dysuria, urgency and frequency.  Musculoskeletal: Negative for myalgias and joint pain.  Skin: Negative for rash.       No lesions  Neurological: Negative for speech change, focal weakness and weakness.  Endo/Heme/Allergies: Does not bruise/bleed easily.       No temperature intolerance  Psychiatric/Behavioral: Negative for depression and suicidal ideas.    Blood pressure 117/80, pulse 57, temperature 98.7 F (37.1 C), temperature source Oral, resp. rate 16, height _0  (1.702 m), weight 87.544 kg (193 lb), SpO2 100 %. Physical Exam  Constitutional: He is oriented to person, place, and time. He appears well-developed and well-nourished. No distress.  HENT:  Head: Normocephalic and atraumatic.  Eyes: Conjunctivae and EOM are normal. Pupils are equal, round, and reactive to light. No scleral icterus.  Neck: Normal range of motion. Neck supple. No JVD present. No tracheal deviation present. No thyromegaly present.  Cardiovascular: Normal rate, regular rhythm, normal heart sounds and intact distal pulses.  Exam reveals no gallop and no friction rub.   No murmur heard. Respiratory: Effort normal and breath sounds normal. No respiratory distress.  GI: Soft. Bowel sounds are normal. He exhibits no distension. There is tenderness. There is no rebound and no guarding.  Genitourinary:  Deferred  Musculoskeletal: Normal range of motion. He exhibits tenderness. He exhibits no edema.  Lymphadenopathy:    He has no cervical adenopathy.  Neurological: He is alert and oriented to person, place, and time. No cranial nerve deficit.  Skin: Skin is warm and dry. No rash noted. No erythema.  Psychiatric: He has a normal mood and affect. His behavior is normal. Judgment and thought content normal.     Assessment/Plan This is a 54 year old  [place] American male admitted for rhabdomyolysis.  1. Rhabdomyolysis: Likely secondary to overexertion and mild dehydration. The patient is not on any medications that specifically cause rhabdo. We will aggressively hydrate the patient and track his potassium and renal function. He is urinating very well at this time but if there is any concern about decreasing renal function we will place a Foley catheter. Muscle soreness is present in all extremities but right calf swelling is still present. The patient states that it is decreased in size from outpatient doctor's visit  a few days ago. There are no palpable cords or edema to indicate DVT.  The patient's heart rate is normal and he does not have any shortness of breath. There is no concern for DVT at this time. 2. Acute kidney injury: Continue to hydrate the patient. Avoid nephrotoxic agents 3. Hypertension: Controlled; continue amiloride, amlodipine and carvedilol. 4. Transaminitis: Secondary to rhabdomyolysis. Continue intravenous fluid 5. BPH: Continue tamsulosin 6. Depression: With bipolar and sometimes psychotic features. Under control on current medication regimen. 7. DVT prophylaxis: Heparin 8. GI prophylaxis: None The patient is a full code. Time spent on admission orders and patient care approximately 35 minutes  Harrie Foreman 03/04/2015, 2:30 AM

## 2015-03-05 LAB — BASIC METABOLIC PANEL
ANION GAP: 4 — AB (ref 5–15)
BUN: 20 mg/dL (ref 6–20)
CALCIUM: 8.6 mg/dL — AB (ref 8.9–10.3)
CO2: 24 mmol/L (ref 22–32)
Chloride: 112 mmol/L — ABNORMAL HIGH (ref 101–111)
Creatinine, Ser: 1.64 mg/dL — ABNORMAL HIGH (ref 0.61–1.24)
GFR calc non Af Amer: 46 mL/min — ABNORMAL LOW (ref >60)
GFR, EST AFRICAN AMERICAN: 53 mL/min — AB (ref >60)
Glucose, Bld: 97 mg/dL (ref 65–99)
Potassium: 3.9 mmol/L (ref 3.5–5.1)
Sodium: 140 mmol/L (ref 135–145)

## 2015-03-05 LAB — CK: Total CK: 1679 U/L — ABNORMAL HIGH (ref 49–397)

## 2015-03-05 NOTE — Progress Notes (Signed)
Pt in NAD, skin warm and dry, denies any pain or discomfort.  IV and telemetry discontinued per policy and procedure.  Discharge instructions given to and reviewed with patient, patient verbalized understanding.  Pt discharged to home, awaiting transport.

## 2015-03-05 NOTE — Discharge Summary (Signed)
[facility] NAME: Donald Small    MR#:  [id number]  DATE OF BIRTH:  10-18-1960  DATE OF ADMISSION:  03/03/2015 ADMITTING PHYSICIAN: Harrie Foreman, MD  DATE OF DISCHARGE: 03/05/2015  PRIMARY CARE PHYSICIAN: Einar Pheasant, MD    ADMISSION DIAGNOSIS:  Non-traumatic rhabdomyolysis [M62.82]  DISCHARGE DIAGNOSIS:  Acute non traumatic Rhabdomyolysis  SECONDARY DIAGNOSIS:   Past Medical History  Diagnosis Date  . Arthritis   . Depression     With psychotic features  . Hypertension   . Benign prostatic hypertrophy     HOSPITAL COURSE:   1. Acute nontraumatic rhabdomyolysis. Likely this is from extended pushing of lawnmower. I advised him to stop mowing lawns for at least 2 weeks. Patient was given vigorous IV fluid hydration during the hospital course. CPK trended down from 2600 down to 1600. I offered the patient to stay another day for IV fluid hydration but he wanted to get home. He must stay hydrated at home. I advised him to stay away from caffeinated beverages. I told them if this rhabdomyolysis comes back again he must be taken off the olanzapine (side effect of this medication can be rhabdomyolysis). Patient is hesitant on coming off this medication at this point. 2. Essential hypertension blood pressure stable on usual medications. 3. BPH without urinary symptoms- on Flomax. 4. Chronic kidney disease stage III- looking back at old records this is not acute renal failure. This is chronic kidney disease which did improve with IV fluid hydration. 5. Bipolar disorder with depression- patient follows at the [facility] for psychiatry treatment. Need to watch lithium levels as outpatient with chronic kidney disease. If rhabdomyolysis occurs again need to get off olanzapine. 6. Elevated liver function tests likely secondary to rhabdomyolysis.  DISCHARGE CONDITIONS:   Satisfactory. Must stay hydrated  CONSULTS OBTAINED:   None  DRUG ALLERGIES:  No Known Allergies  DISCHARGE MEDICATIONS:   Current Discharge Medication List    CONTINUE these medications which have NOT CHANGED   Details  aMILoride (MIDAMOR) 5 MG tablet Take 5 mg by mouth daily.    amLODipine (NORVASC) 10 MG tablet Take 10 mg by mouth daily.    carvedilol (COREG) 12.5 MG tablet Take 12.5 mg by mouth 2 (two) times daily with a meal.    lithium carbonate 300 MG capsule Take 600 mg by mouth at bedtime.     OLANZapine (ZYPREXA) 10 MG tablet Take 10 mg by mouth at bedtime.    Omega-3 Fatty Acids (FISH OIL PO) Take 1 capsule by mouth 2 (two) times daily.     tamsulosin (FLOMAX) 0.4 MG CAPS capsule Take 0.4 mg by mouth daily.          DISCHARGE INSTRUCTIONS:   Follow-up with Dr. Einar Pheasant 1 week. No mowing lawns for 2 weeks.  If you experience worsening of your admission symptoms, develop shortness of breath, life threatening emergency, suicidal or homicidal thoughts you must seek medical attention immediately by calling 911 or calling your MD immediately  if symptoms less severe.  You Must read complete instructions/literature along with all the possible adverse reactions/side effects for all the Medicines you take and that have been prescribed to you. Take any new Medicines after you have completely understood and accept all the possible adverse reactions/side effects.   Please note  You were cared for by a hospitalist during your hospital stay. If you have any questions about your discharge medications or the care  you received while you were in the hospital after you are discharged, you can call the unit and asked to speak with the hospitalist on call if the hospitalist that took care of you is not available. Once you are discharged, your primary care physician will handle any further medical issues. Please note that NO REFILLS for any discharge medications will be authorized once you are discharged, as it is imperative that you  return to your primary care physician (or establish a relationship with a primary care physician if you do not have one) for your aftercare needs so that they can reassess your need for medications and monitor your lab values.    Today   CHIEF COMPLAINT:   Chief Complaint  Patient presents with  . Dehydration    HISTORY OF PRESENT ILLNESS:  Donald Small  is a 54 y.o. male with a known history of hypertension. Patient presents with lower extremity leg pain. Found in rhabdomyolysis.   VITAL SIGNS:  Blood pressure 121/78, pulse 59, temperature 97.9 F (36.6 C), temperature source Oral, resp. rate 16, height 5\' 7"  (1.702 m), weight 87.454 kg (192 lb 12.8 oz), SpO2 97 %.  I/O:   Intake/Output Summary (Last 24 hours) at 03/05/15 0830 Last data filed at 03/05/15 0802  Gross per 24 hour  Intake    840 ml  Output   9175 ml  Net  -8335 ml    PHYSICAL EXAMINATION:  GENERAL:  54 y.o.-year-old patient lying in the bed with no acute distress.  EYES: Pupils equal, round, reactive to light and accommodation. No scleral icterus. Extraocular muscles intact.  HEENT: Head atraumatic, normocephalic. Oropharynx and nasopharynx clear.  NECK:  Supple, no jugular venous distention. No thyroid enlargement, no tenderness.  LUNGS: Normal breath sounds bilaterally, no wheezing, rales,rhonchi or crepitation. No use of accessory muscles of respiration.  CARDIOVASCULAR: S1, S2 normal. No murmurs, rubs, or gallops.  ABDOMEN: Soft, non-tender, non-distended. Bowel sounds present. No organomegaly or mass.  EXTREMITIES: No pedal edema, cyanosis, or clubbing.  NEUROLOGIC: Cranial nerves II through XII are intact. Muscle strength 5/5 in all extremities. Sensation intact. Gait not checked.  PSYCHIATRIC: The patient is alert and oriented x 3.  SKIN: No obvious rash, lesion, or ulcer.   DATA REVIEW:   CBC  Recent Labs Lab 03/04/15 0403  WBC 8.3  HGB 11.2*  HCT 33.8*  PLT 173    Chemistries    Recent Labs Lab 03/03/15 1040  03/04/15 0403 03/05/15 0437  NA 136  < >  --  140  K 4.6  < > 3.7 3.9  CL 103  < >  --  112*  CO2 28  < >  --  24  GLUCOSE 80  < >  --  97  BUN 23  < >  --  20  CREATININE 1.84*  < > 1.79* 1.64*  CALCIUM 9.4  < >  --  8.6*  MG  --   --  2.0  --   AST 137*  --   --   --   ALT 149*  --   --   --   ALKPHOS 55  --   --   --   BILITOT 0.4  --   --   --   < > = values in this interval not displayed.  Management plans discussed with the patient, and he is in agreement.  CODE STATUS:     Code Status Orders  Start     Ordered   03/04/15 0253  Full code   Continuous     03/04/15 0252      TOTAL TIME TAKING CARE OF THIS PATIENT: 36 minutes.    Loletha Grayer M.D on 03/05/2015 at 8:30 AM  Between 7am to 6pm - Pager - [phone]  After 6pm go to [email or web address] - password [email or web address] [facility]  Office  [phone]  CC: Primary care physician; Einar Pheasant, MD

## 2015-03-05 NOTE — Discharge Instructions (Signed)
Rhabdomyolysis Rhabdomyolysis is the breakdown of muscle fibers due to injury. The injury may come from physical damage to the muscle like an injury but other causes are:  High fever (hyperthermia).  Seizures (convulsions).  Low phosphate levels.  Diseases of metabolism.  Heatstroke.  Drug toxicity.  Over exertion.  Alcoholism.  Muscle is cut off from oxygen (anoxia).  The squeezing of nerves and blood vessels (compartment syndrome). Some drugs which may cause the breakdown of muscle are:  Antibiotics.  Statins.  Alcohol.  Animal toxins. Myoglobin is a substance which helps muscle use oxygen. When the muscle is damaged, the myoglobin is released into the bloodstream. It is filtered out of the bloodstream by the kidneys. Myoglobin may block up the kidneys. This may cause damage, such as kidney failure. It also breaks down into other damaging toxic parts, which also cause kidney failure.  SYMPTOMS   Dark, red, or tea colored urine.  Weakness of affected muscles.  Weight gain from water retention.  Joint aches and pains.  Irregular heart from high potassium in the blood.  Muscle tenderness or aching.  Generalized weakness.  Seizures.  Feeling tired (fatigue). DIAGNOSIS  Your caregiver may find muscle tenderness on exam and suspect the problem. Urine tests and blood work can confirm the problem. TREATMENT   Early and aggressive treatment with large amounts of fluids may help prevent kidney failure.  Water producing medicine (diuretic) may be used to help flush the kidneys.  High potassium and calcium problems (electrolyte) in your blood may need treatment. HOME CARE INSTRUCTIONS  This problem is usually cared for in a hospital. If you are allowed to go home and require dialysis, make sure you keep all appointments for lab work and dialysis. Not doing so could result in death. Document Released: 09-13-04 Document Revised: 12/02/2011 Document Reviewed:  03/06/2009 [facility] Patient Information 2015 [place], [place]. This information is not intended to replace advice given to you by your health care provider. Make sure you discuss any questions you have with your health care provider.

## 2015-03-06 ENCOUNTER — Telehealth: Payer: Self-pay | Admitting: *Deleted

## 2015-03-06 LAB — ANA: Anti Nuclear Antibody(ANA): NEGATIVE

## 2015-03-06 NOTE — Telephone Encounter (Addendum)
Discharge Date:  03/05/15  Transition Care Management Follow-up Telephone Call  How have you been since you were released from the hospital? Pt states he feels ok, still having muscle soreness in legs, but it has improved and he isn't as weak as when he saw Dr. Nicki Reaper last week.   Do you understand why you were in the hospital? YES, Acute non traumatic Rhabdomyolysis   Do you understand the discharge instructions? YES, No mowing lawns for 2 weeks. Stay hydrated. No caffeine. Pt states he is following the discharge instructions.  Items Reviewed:  Medications reviewed: YES, no new medications  Allergies reviewed: YES, no new drug allergies  Dietary changes reviewed: YES, stay hydrated, avoid caffeinated beverages  Referrals reviewed: n/a   Functional Questionnaire:   Activities of Daily Living (ADLs):  States they are independent in the following: All States they require assistance with the following: None   Any transportation issues/concerns?: NO, drives self   Any patient concerns? Not at this time. Advised to call back with any questions or concerns prior to his appt,  verbalized understanding   Confirmed importance and date/time of follow-up visits scheduled: YES, confirmed  appt 03/09/15 @ 1:30 with Dr. Nicki Reaper   Confirmed with patient if condition begins to worsen call PCP or go to the ER. Patient was given the Call-a-Nurse line [phone]: YES

## 2015-03-06 NOTE — Telephone Encounter (Signed)
Left message for pt to return my call.

## 2015-03-06 NOTE — Telephone Encounter (Signed)
TCM Call completed. Pt wanted to let Dr. Nicki Reaper know that he has liver ultrasound scheduled for 03/21/15 @ [place].

## 2015-03-07 NOTE — Telephone Encounter (Signed)
Pt already has appt scheduled 03/09/15

## 2015-03-07 NOTE — Telephone Encounter (Signed)
I can see him on Friday (03/10/15) - for hospital follow up.

## 2015-03-09 ENCOUNTER — Ambulatory Visit (INDEPENDENT_AMBULATORY_CARE_PROVIDER_SITE_OTHER): Payer: Medicare Other | Admitting: Internal Medicine

## 2015-03-09 ENCOUNTER — Encounter: Payer: Self-pay | Admitting: Internal Medicine

## 2015-03-09 VITALS — BP 118/78 | HR 66 | Temp 98.1°F | Ht 67.0 in | Wt 198.2 lb

## 2015-03-09 DIAGNOSIS — I10 Essential (primary) hypertension: Secondary | ICD-10-CM

## 2015-03-09 DIAGNOSIS — D649 Anemia, unspecified: Secondary | ICD-10-CM | POA: Diagnosis not present

## 2015-03-09 DIAGNOSIS — R7989 Other specified abnormal findings of blood chemistry: Secondary | ICD-10-CM

## 2015-03-09 DIAGNOSIS — M6282 Rhabdomyolysis: Secondary | ICD-10-CM | POA: Diagnosis not present

## 2015-03-09 DIAGNOSIS — M791 Myalgia, unspecified site: Secondary | ICD-10-CM

## 2015-03-09 DIAGNOSIS — N289 Disorder of kidney and ureter, unspecified: Secondary | ICD-10-CM | POA: Diagnosis not present

## 2015-03-09 DIAGNOSIS — R945 Abnormal results of liver function studies: Secondary | ICD-10-CM

## 2015-03-09 DIAGNOSIS — F329 Major depressive disorder, single episode, unspecified: Secondary | ICD-10-CM

## 2015-03-09 DIAGNOSIS — F32A Depression, unspecified: Secondary | ICD-10-CM

## 2015-03-09 LAB — CBC WITH DIFFERENTIAL/PLATELET
Basophils Absolute: 0 10*3/uL (ref 0.0–0.1)
Basophils Relative: 0.3 % (ref 0.0–3.0)
Eosinophils Absolute: 0.5 10*3/uL (ref 0.0–0.7)
Eosinophils Relative: 6 % — ABNORMAL HIGH (ref 0.0–5.0)
HEMATOCRIT: 36 % — AB (ref 39.0–52.0)
HEMOGLOBIN: 11.9 g/dL — AB (ref 13.0–17.0)
LYMPHS ABS: 1.6 10*3/uL (ref 0.7–4.0)
Lymphocytes Relative: 18.2 % (ref 12.0–46.0)
MCHC: 33.1 g/dL (ref 30.0–36.0)
MCV: 93.5 fl (ref 78.0–100.0)
MONO ABS: 0.6 10*3/uL (ref 0.1–1.0)
Monocytes Relative: 6.9 % (ref 3.0–12.0)
NEUTROS ABS: 6.2 10*3/uL (ref 1.4–7.7)
Neutrophils Relative %: 68.6 % (ref 43.0–77.0)
Platelets: 210 10*3/uL (ref 150.0–400.0)
RBC: 3.85 Mil/uL — AB (ref 4.22–5.81)
RDW: 12.6 % (ref 11.5–15.5)
WBC: 9 10*3/uL (ref 4.0–10.5)

## 2015-03-09 LAB — BASIC METABOLIC PANEL
BUN: 32 mg/dL — ABNORMAL HIGH (ref 6–23)
CALCIUM: 8.9 mg/dL (ref 8.4–10.5)
CO2: 26 mEq/L (ref 19–32)
Chloride: 100 mEq/L (ref 96–112)
Creatinine, Ser: 1.84 mg/dL — ABNORMAL HIGH (ref 0.40–1.50)
GFR: 49.42 mL/min — ABNORMAL LOW (ref >60.00)
Glucose, Bld: 67 mg/dL — ABNORMAL LOW (ref 70–99)
Potassium: 4.6 mEq/L (ref 3.5–5.1)
SODIUM: 131 meq/L — AB (ref 135–145)

## 2015-03-09 LAB — FERRITIN: FERRITIN: 91.3 ng/mL (ref 22.0–322.0)

## 2015-03-09 LAB — IBC PANEL
IRON: 55 ug/dL (ref 42–165)
Saturation Ratios: 20.4 % (ref 20.0–50.0)
Transferrin: 193 mg/dL — ABNORMAL LOW (ref 212.0–360.0)

## 2015-03-09 LAB — SEDIMENTATION RATE: SED RATE: 71 mm/h — AB (ref 0–22)

## 2015-03-09 LAB — CK: Total CK: 2237 U/L — ABNORMAL HIGH (ref 7–232)

## 2015-03-09 NOTE — Progress Notes (Signed)
Patient ID: Donald Small, male   DOB: Mar 23, 1961, 54 y.o.   MRN: [id number]   Subjective:    Patient ID: Donald Small, male    DOB: Jun 06, 1961, 54 y.o.   MRN: [id number]  HPI  Patient here for a hospital follow up.  Was recently admitted with elevated CK.  Felt to be related to rhabdomyolysis.  See hospital note for details.  Was hydrated.  CK started trending down while in the hospital.  Since discharge, he has continued to remain hydrated.  Still with some muscle soreness, but overall better.  Able to move better.  Legs and arms still with some aching.  Eating and drinking well.  No nausea or vomiting.  No bowel change.  Has elevated liver function tests.  Scheduled for abdominal ultrasound 03/21/15.     Past Medical History  Diagnosis Date  . Arthritis   . Depression     With psychotic features  . Hypertension   . Benign prostatic hypertrophy     Current Outpatient Prescriptions on File Prior to Visit  Medication Sig Dispense Refill  . aMILoride (MIDAMOR) 5 MG tablet Take 5 mg by mouth daily.    Marland Kitchen amLODipine (NORVASC) 10 MG tablet Take 10 mg by mouth daily.    . carvedilol (COREG) 12.5 MG tablet Take 12.5 mg by mouth 2 (two) times daily with a meal.    . lithium carbonate 300 MG capsule Take 600 mg by mouth at bedtime.     Marland Kitchen OLANZapine (ZYPREXA) 10 MG tablet Take 10 mg by mouth at bedtime.    . Omega-3 Fatty Acids (FISH OIL PO) Take 1 capsule by mouth 2 (two) times daily.     . tamsulosin (FLOMAX) 0.4 MG CAPS capsule Take 0.8 mg by mouth daily.      No current facility-administered medications on file prior to visit.    Review of Systems  Constitutional: Negative for appetite change and unexpected weight change.  HENT: Negative for congestion and sinus pressure.   Respiratory: Negative for cough, chest tightness and shortness of breath.   Cardiovascular: Negative for chest pain, palpitations and leg swelling.  Gastrointestinal: Negative for nausea, vomiting,  abdominal pain and diarrhea.  Musculoskeletal:       No calf pain.  Thighs are better.  Still some aching in his arms.  Overall able to move better and get up and down better.    Skin: Negative for color change and rash.  Neurological: Negative for dizziness, light-headedness and headaches.  Psychiatric/Behavioral: Negative for dysphoric mood and agitation.       Objective:     Blood pressure recheck:  112/72  Physical Exam  Constitutional: He appears well-developed and well-nourished. No distress.  HENT:  Nose: Nose normal.  Mouth/Throat: Oropharynx is clear and moist.  Neck: Neck supple. No thyromegaly present.  Cardiovascular: Normal rate and regular rhythm.   Pulmonary/Chest: Effort normal and breath sounds normal. No respiratory distress.  Abdominal: Soft. Bowel sounds are normal. There is no tenderness.  Musculoskeletal: He exhibits no edema.  Able to go from sitting to standing and walking with less pain.  Decreased tenderness to palpation.  Motor strength equal bilateral lower and upper extremities.    Lymphadenopathy:    He has no cervical adenopathy.  Skin: No rash noted. No erythema.  Psychiatric: He has a normal mood and affect. His behavior is normal.    BP 118/78 mmHg  Pulse 66  Temp(Src) 98.1 F (36.7 C) (Oral)  Ht 5'  7" (1.702 m)  Wt 198 lb 4 oz (89.926 kg)  BMI 31.04 kg/m2  SpO2 97% Wt Readings from Last 3 Encounters:  03/09/15 198 lb 4 oz (89.926 kg)  03/05/15 192 lb 12.8 oz (87.454 kg)  03/03/15 193 lb 4 oz (87.658 kg)     Lab Results  Component Value Date   WBC 9.0 03/09/2015   HGB 11.9* 03/09/2015   HCT 36.0* 03/09/2015   PLT 210.0 03/09/2015   GLUCOSE 67* 03/09/2015   CHOL 184 02/03/2015   TRIG 138.0 02/03/2015   HDL 35.00* 02/03/2015   LDLCALC 121* 02/03/2015   ALT 149* 03/03/2015   AST 137* 03/03/2015   NA 131* 03/09/2015   K 4.6 03/09/2015   CL 100 03/09/2015   CREATININE 1.84* 03/09/2015   BUN 32* 03/09/2015   CO2 26 03/09/2015     TSH 3.032 03/04/2015   HGBA1C 5.5 11/22/2010       Assessment & Plan:   Problem List Items Addressed This Visit    Abnormal liver function tests    Scheduled for abdominal ultrasound 03/21/15.  Will need f/u liver panel. Further w/up pending.        Relevant Orders   Hepatic function panel   Anemia    Slight decrease in the hospital.  Recheck cbc and iron studies today.       Relevant Orders   CBC with Differential/Platelet (Completed)   Ferritin (Completed)   IBC panel (Completed)   Depression    Has been stable.  Sees psychiatry.  Will have to follow on the zyprexa (with rhabdo).  Hold on making any changes in psych meds.       Essential hypertension, benign    Blood pressure doing well.   Continue current medication regimen.        Muscle ache    Is better.  Exam as outlined.  Felt to be related to rhabdo.  Recheck ck and met b today.  D/w rheumatology.  Follow to confirm diagnosis.       Renal insufficiency    Has a history of renal insuf (CKD).  Followed by nephrology.  Cr stable.  Recheck today.  Follow closely.       Rhabdomyolysis - Primary    Recently admitted and hydrated.  CK improved some.  Has been trying to stay hydrated and avoid strenuous activity.  Recheck CK and renal function today.  D/w rheumatology.        Relevant Orders   Sedimentation rate (Completed)   Basic metabolic panel (Completed)   CK (Creatine Kinase) (Completed)       Einar Pheasant, MD

## 2015-03-09 NOTE — Progress Notes (Signed)
Pre visit review using our clinic review tool, if applicable. No additional management support is needed unless otherwise documented below in the visit note. 

## 2015-03-13 ENCOUNTER — Other Ambulatory Visit: Payer: Self-pay | Admitting: Internal Medicine

## 2015-03-13 ENCOUNTER — Encounter: Payer: Self-pay | Admitting: Internal Medicine

## 2015-03-13 DIAGNOSIS — M6282 Rhabdomyolysis: Secondary | ICD-10-CM

## 2015-03-13 NOTE — Assessment & Plan Note (Signed)
Recently admitted and hydrated.  CK improved some.  Has been trying to stay hydrated and avoid strenuous activity.  Recheck CK and renal function today.  D/w rheumatology.

## 2015-03-13 NOTE — Assessment & Plan Note (Signed)
Has a history of renal insuf (CKD).  Followed by nephrology.  Cr stable.  Recheck today.  Follow closely.

## 2015-03-13 NOTE — Assessment & Plan Note (Signed)
Scheduled for abdominal ultrasound 03/21/15.  Will need f/u liver panel. Further w/up pending.

## 2015-03-13 NOTE — Progress Notes (Signed)
Orders placed for f/u labs.  

## 2015-03-13 NOTE — Assessment & Plan Note (Signed)
Has been stable.  Sees psychiatry.  Will have to follow on the zyprexa (with rhabdo).  Hold on making any changes in psych meds.

## 2015-03-13 NOTE — Assessment & Plan Note (Signed)
Is better.  Exam as outlined.  Felt to be related to rhabdo.  Recheck ck and met b today.  D/w rheumatology.  Follow to confirm diagnosis.

## 2015-03-13 NOTE — Assessment & Plan Note (Signed)
Slight decrease in the hospital.  Recheck cbc and iron studies today.

## 2015-03-13 NOTE — Assessment & Plan Note (Signed)
Blood pressure doing well.   Continue current medication regimen.

## 2015-03-15 ENCOUNTER — Other Ambulatory Visit (INDEPENDENT_AMBULATORY_CARE_PROVIDER_SITE_OTHER): Payer: Medicare Other

## 2015-03-15 DIAGNOSIS — M6282 Rhabdomyolysis: Secondary | ICD-10-CM | POA: Diagnosis not present

## 2015-03-15 DIAGNOSIS — R7989 Other specified abnormal findings of blood chemistry: Secondary | ICD-10-CM

## 2015-03-15 DIAGNOSIS — R945 Abnormal results of liver function studies: Secondary | ICD-10-CM

## 2015-03-15 LAB — BASIC METABOLIC PANEL
BUN: 24 mg/dL — AB (ref 6–23)
CHLORIDE: 100 meq/L (ref 96–112)
CO2: 26 mEq/L (ref 19–32)
Calcium: 9.2 mg/dL (ref 8.4–10.5)
Creatinine, Ser: 1.7 mg/dL — ABNORMAL HIGH (ref 0.40–1.50)
GFR: 54.14 mL/min — ABNORMAL LOW (ref >60.00)
GLUCOSE: 97 mg/dL (ref 70–99)
POTASSIUM: 4.6 meq/L (ref 3.5–5.1)
SODIUM: 129 meq/L — AB (ref 135–145)

## 2015-03-15 LAB — HEPATIC FUNCTION PANEL
ALT: 103 U/L — ABNORMAL HIGH (ref 0–53)
AST: 76 U/L — ABNORMAL HIGH (ref 0–37)
Albumin: 3.5 g/dL (ref 3.5–5.2)
Alkaline Phosphatase: 52 U/L (ref 39–117)
BILIRUBIN DIRECT: 0.1 mg/dL (ref 0.0–0.3)
BILIRUBIN TOTAL: 0.4 mg/dL (ref 0.2–1.2)
TOTAL PROTEIN: 7.5 g/dL (ref 6.0–8.3)

## 2015-03-15 LAB — CK: Total CK: 1120 U/L — ABNORMAL HIGH (ref 7–232)

## 2015-03-16 ENCOUNTER — Other Ambulatory Visit: Payer: Self-pay | Admitting: Internal Medicine

## 2015-03-16 DIAGNOSIS — E871 Hypo-osmolality and hyponatremia: Secondary | ICD-10-CM

## 2015-03-16 DIAGNOSIS — M6282 Rhabdomyolysis: Secondary | ICD-10-CM

## 2015-03-16 LAB — LITHIUM LEVEL: LITHIUM LVL: 0.5 meq/L — AB (ref 0.80–1.40)

## 2015-03-16 NOTE — Progress Notes (Signed)
Order placed for f/u labs.  

## 2015-03-17 ENCOUNTER — Other Ambulatory Visit (INDEPENDENT_AMBULATORY_CARE_PROVIDER_SITE_OTHER): Payer: Medicare Other

## 2015-03-17 DIAGNOSIS — E871 Hypo-osmolality and hyponatremia: Secondary | ICD-10-CM | POA: Diagnosis not present

## 2015-03-17 DIAGNOSIS — M6282 Rhabdomyolysis: Secondary | ICD-10-CM | POA: Diagnosis not present

## 2015-03-17 LAB — CK: Total CK: 1150 U/L — ABNORMAL HIGH (ref 7–232)

## 2015-03-17 LAB — BASIC METABOLIC PANEL
BUN: 24 mg/dL — ABNORMAL HIGH (ref 6–23)
CALCIUM: 8.9 mg/dL (ref 8.4–10.5)
CHLORIDE: 101 meq/L (ref 96–112)
CO2: 25 mEq/L (ref 19–32)
Creatinine, Ser: 1.56 mg/dL — ABNORMAL HIGH (ref 0.40–1.50)
GFR: 59.79 mL/min — AB (ref >60.00)
Glucose, Bld: 98 mg/dL (ref 70–99)
Potassium: 4.4 mEq/L (ref 3.5–5.1)
Sodium: 131 mEq/L — ABNORMAL LOW (ref 135–145)

## 2015-03-20 ENCOUNTER — Other Ambulatory Visit: Payer: Self-pay | Admitting: Internal Medicine

## 2015-03-20 DIAGNOSIS — R945 Abnormal results of liver function studies: Secondary | ICD-10-CM

## 2015-03-20 DIAGNOSIS — M6282 Rhabdomyolysis: Secondary | ICD-10-CM

## 2015-03-20 DIAGNOSIS — R7989 Other specified abnormal findings of blood chemistry: Secondary | ICD-10-CM

## 2015-03-20 NOTE — Progress Notes (Signed)
Order placed for f/u labs.  

## 2015-03-24 ENCOUNTER — Other Ambulatory Visit: Payer: Medicare Other

## 2015-03-24 DIAGNOSIS — M6282 Rhabdomyolysis: Secondary | ICD-10-CM

## 2015-03-24 DIAGNOSIS — R945 Abnormal results of liver function studies: Secondary | ICD-10-CM

## 2015-03-24 DIAGNOSIS — R7989 Other specified abnormal findings of blood chemistry: Secondary | ICD-10-CM

## 2015-03-24 DIAGNOSIS — R799 Abnormal finding of blood chemistry, unspecified: Secondary | ICD-10-CM | POA: Diagnosis not present

## 2015-03-25 ENCOUNTER — Other Ambulatory Visit: Payer: Self-pay | Admitting: Internal Medicine

## 2015-03-25 DIAGNOSIS — M6282 Rhabdomyolysis: Secondary | ICD-10-CM

## 2015-03-25 DIAGNOSIS — R945 Abnormal results of liver function studies: Secondary | ICD-10-CM

## 2015-03-25 DIAGNOSIS — R7989 Other specified abnormal findings of blood chemistry: Secondary | ICD-10-CM

## 2015-03-25 DIAGNOSIS — N289 Disorder of kidney and ureter, unspecified: Secondary | ICD-10-CM

## 2015-03-25 LAB — HEPATIC FUNCTION PANEL
ALK PHOS: 46 U/L (ref 39–117)
ALT: 72 U/L — AB (ref 0–53)
AST: 66 U/L — ABNORMAL HIGH (ref 0–37)
Albumin: 3.5 g/dL (ref 3.5–5.2)
Total Bilirubin: 0.2 mg/dL (ref 0.2–1.2)
Total Protein: 7.4 g/dL (ref 6.0–8.3)

## 2015-03-25 LAB — BASIC METABOLIC PANEL
BUN: 18 mg/dL (ref 6–23)
CO2: 26 meq/L (ref 19–32)
CREATININE: 1.57 mg/dL — AB (ref 0.50–1.35)
Calcium: 8.5 mg/dL (ref 8.4–10.5)
Chloride: 103 mEq/L (ref 96–112)
Glucose, Bld: 68 mg/dL — ABNORMAL LOW (ref 70–99)
Potassium: 3.7 mEq/L (ref 3.5–5.3)
Sodium: 136 mEq/L (ref 135–145)

## 2015-03-25 LAB — CK: Total CK: 1049 U/L — ABNORMAL HIGH (ref 7–232)

## 2015-03-25 NOTE — Progress Notes (Signed)
Orders placed for f/u labs.  

## 2015-03-28 ENCOUNTER — Other Ambulatory Visit: Payer: Medicare Other

## 2015-04-04 ENCOUNTER — Telehealth: Payer: Self-pay

## 2015-04-04 NOTE — Telephone Encounter (Signed)
Please advise 

## 2015-04-04 NOTE — Telephone Encounter (Signed)
The patient called hoping to get information regarding his liver biopsy that was done at the [place].

## 2015-04-05 NOTE — Telephone Encounter (Signed)
Please call pt and notify him that I received the ultrasound results.  Confirm no liver biopsy was performed.  The ultrasound reveals the liver to be ok.  No mass or other liver abnormality noted.  He does have a gall stone, but no evidence of inflammation or duct dilation.  Given the persistent slight increase in his liver function tests, I would like to refer him to GI for further evaluation.  If agreeable, let me know and I will place the order for the referral.

## 2015-04-06 NOTE — Telephone Encounter (Signed)
Pt notified & would like to hold off on referral right now & discuss with his [place] doctor

## 2015-04-06 NOTE — Telephone Encounter (Signed)
Mailed CD back to patient for him to keep for his records

## 2015-04-19 ENCOUNTER — Other Ambulatory Visit (INDEPENDENT_AMBULATORY_CARE_PROVIDER_SITE_OTHER): Payer: Medicare Other

## 2015-04-19 DIAGNOSIS — R7989 Other specified abnormal findings of blood chemistry: Secondary | ICD-10-CM

## 2015-04-19 DIAGNOSIS — R945 Abnormal results of liver function studies: Secondary | ICD-10-CM

## 2015-04-19 DIAGNOSIS — N289 Disorder of kidney and ureter, unspecified: Secondary | ICD-10-CM

## 2015-04-19 DIAGNOSIS — M6282 Rhabdomyolysis: Secondary | ICD-10-CM | POA: Diagnosis not present

## 2015-04-19 LAB — BASIC METABOLIC PANEL
BUN: 19 mg/dL (ref 6–23)
CHLORIDE: 95 meq/L — AB (ref 96–112)
CO2: 27 mEq/L (ref 19–32)
Calcium: 9 mg/dL (ref 8.4–10.5)
Creatinine, Ser: 1.6 mg/dL — ABNORMAL HIGH (ref 0.40–1.50)
GFR: 58.05 mL/min — AB (ref >60.00)
GLUCOSE: 88 mg/dL (ref 70–99)
POTASSIUM: 4.4 meq/L (ref 3.5–5.1)
Sodium: 126 mEq/L — ABNORMAL LOW (ref 135–145)

## 2015-04-19 LAB — HEPATIC FUNCTION PANEL
ALT: 70 U/L — ABNORMAL HIGH (ref 0–53)
AST: 68 U/L — ABNORMAL HIGH (ref 0–37)
Albumin: 3.7 g/dL (ref 3.5–5.2)
Alkaline Phosphatase: 50 U/L (ref 39–117)
BILIRUBIN DIRECT: 0.1 mg/dL (ref 0.0–0.3)
Total Bilirubin: 0.5 mg/dL (ref 0.2–1.2)
Total Protein: 7.9 g/dL (ref 6.0–8.3)

## 2015-04-19 LAB — CK: CK TOTAL: 1232 U/L — AB (ref 7–232)

## 2015-04-20 ENCOUNTER — Other Ambulatory Visit: Payer: Self-pay | Admitting: Internal Medicine

## 2015-04-20 ENCOUNTER — Encounter: Payer: Self-pay | Admitting: *Deleted

## 2015-04-20 DIAGNOSIS — R748 Abnormal levels of other serum enzymes: Secondary | ICD-10-CM

## 2015-04-20 NOTE — Progress Notes (Signed)
Order placed for rheumatology referral.  

## 2015-04-21 ENCOUNTER — Other Ambulatory Visit: Payer: Medicare Other

## 2015-04-21 ENCOUNTER — Telehealth: Payer: Self-pay | Admitting: *Deleted

## 2015-04-21 DIAGNOSIS — E871 Hypo-osmolality and hyponatremia: Secondary | ICD-10-CM

## 2015-04-21 NOTE — Telephone Encounter (Signed)
Order placed for lab 

## 2015-04-21 NOTE — Telephone Encounter (Signed)
Labs and dx?  

## 2015-04-22 LAB — SODIUM: SODIUM: 135 mmol/L (ref 135–146)

## 2015-04-23 ENCOUNTER — Encounter: Payer: Self-pay | Admitting: Internal Medicine

## 2015-04-25 NOTE — Telephone Encounter (Signed)
Unread mychart message mailed to patient 

## 2015-05-04 DIAGNOSIS — R7989 Other specified abnormal findings of blood chemistry: Secondary | ICD-10-CM | POA: Diagnosis not present

## 2015-05-04 DIAGNOSIS — N183 Chronic kidney disease, stage 3 (moderate): Secondary | ICD-10-CM | POA: Diagnosis not present

## 2015-05-04 DIAGNOSIS — G729 Myopathy, unspecified: Secondary | ICD-10-CM | POA: Diagnosis not present

## 2015-05-05 ENCOUNTER — Other Ambulatory Visit: Payer: Self-pay | Admitting: Rheumatology

## 2015-05-05 DIAGNOSIS — G729 Myopathy, unspecified: Secondary | ICD-10-CM

## 2015-05-05 DIAGNOSIS — R945 Abnormal results of liver function studies: Secondary | ICD-10-CM

## 2015-05-05 DIAGNOSIS — R7989 Other specified abnormal findings of blood chemistry: Secondary | ICD-10-CM

## 2015-05-11 ENCOUNTER — Ambulatory Visit: Admission: RE | Admit: 2015-05-11 | Payer: Medicare Other | Source: Ambulatory Visit

## 2015-05-15 DIAGNOSIS — G729 Myopathy, unspecified: Secondary | ICD-10-CM | POA: Diagnosis not present

## 2015-05-26 ENCOUNTER — Ambulatory Visit (INDEPENDENT_AMBULATORY_CARE_PROVIDER_SITE_OTHER): Payer: Medicare Other | Admitting: Internal Medicine

## 2015-05-26 ENCOUNTER — Encounter: Payer: Self-pay | Admitting: Internal Medicine

## 2015-05-26 VITALS — BP 139/87 | HR 59 | Temp 97.9°F | Ht 67.0 in | Wt 194.5 lb

## 2015-05-26 DIAGNOSIS — F329 Major depressive disorder, single episode, unspecified: Secondary | ICD-10-CM

## 2015-05-26 DIAGNOSIS — I10 Essential (primary) hypertension: Secondary | ICD-10-CM

## 2015-05-26 DIAGNOSIS — N289 Disorder of kidney and ureter, unspecified: Secondary | ICD-10-CM | POA: Diagnosis not present

## 2015-05-26 DIAGNOSIS — M6282 Rhabdomyolysis: Secondary | ICD-10-CM

## 2015-05-26 DIAGNOSIS — R7989 Other specified abnormal findings of blood chemistry: Secondary | ICD-10-CM | POA: Diagnosis not present

## 2015-05-26 DIAGNOSIS — E7800 Pure hypercholesterolemia, unspecified: Secondary | ICD-10-CM

## 2015-05-26 DIAGNOSIS — F32A Depression, unspecified: Secondary | ICD-10-CM

## 2015-05-26 DIAGNOSIS — R945 Abnormal results of liver function studies: Secondary | ICD-10-CM

## 2015-05-26 DIAGNOSIS — D649 Anemia, unspecified: Secondary | ICD-10-CM

## 2015-05-26 LAB — HEPATIC FUNCTION PANEL
ALK PHOS: 49 U/L (ref 39–117)
ALT: 64 U/L — AB (ref 0–53)
AST: 58 U/L — ABNORMAL HIGH (ref 0–37)
Albumin: 3.8 g/dL (ref 3.5–5.2)
BILIRUBIN DIRECT: 0 mg/dL (ref 0.0–0.3)
TOTAL PROTEIN: 7.7 g/dL (ref 6.0–8.3)
Total Bilirubin: 0.3 mg/dL (ref 0.2–1.2)

## 2015-05-26 LAB — CK: Total CK: 1331 U/L — ABNORMAL HIGH (ref 7–232)

## 2015-05-26 NOTE — Progress Notes (Signed)
Pre visit review using our clinic review tool, if applicable. No additional management support is needed unless otherwise documented below in the visit note. 

## 2015-05-26 NOTE — Progress Notes (Signed)
Patient ID: Donald Small, male   DOB: May 03, 1961, 54 y.o.   MRN: [id number]   Subjective:    Patient ID: Donald Small, male    DOB: 01-24-1961, 54 y.o.   MRN: [id number]  HPI  Patient here for a scheduled follow up.  Recently admitted with elevated ck and muscle aches.  See previous note for details.  CK stabilized at approximately 1200-1300.  Staying hydrated.  Saw Dr Jefm Bryant.  See his note for details.  Ruling out for inflammatory myopathy.  Planning for MRI (thighs) - 06/21/15.  He was having swelling in her lower extremities.  His amlodipine was stopped.  Swelling is better.  He is concerned his blood pressure is elevated.  Has been checking on an monitor at home.  Blood pressure better here.  States at home blood pressure was 150s/90s. Eating and drinking well.  No nausea or vomiting.  No bowel change.     Past Medical History  Diagnosis Date  . Arthritis   . Depression     With psychotic features  . Hypertension   . Benign prostatic hypertrophy    Past Surgical History  Procedure Laterality Date  . Cyst excision Left 11/06/13    ganglion cyst removed left hand   Family History  Problem Relation Age of Onset  . Hypertension Mother   . Arthritis Father   . Hypertension Father   . Cancer Father     prostate cancer  . Diabetes Brother    Social History   Social History  . Marital Status: Married    Spouse Name: N/A  . Number of Children: N/A  . Years of Education: N/A   Social History Main Topics  . Smoking status: Former Smoker -- 2.00 packs/day for 15 years    Types: Cigarettes    Quit date: 09/23/1988  . Smokeless tobacco: Never Used  . Alcohol Use: No  . Drug Use: No  . Sexual Activity: Not Asked   Other Topics Concern  . None   Social History Narrative    Outpatient Encounter Prescriptions as of 05/26/2015  Medication Sig  . aMILoride (MIDAMOR) 5 MG tablet Take 5 mg by mouth daily.  . carvedilol (COREG) 12.5 MG tablet Take 12.5 mg by mouth  2 (two) times daily with a meal.  . lithium carbonate 300 MG capsule Take 600 mg by mouth at bedtime.   Marland Kitchen OLANZapine (ZYPREXA) 10 MG tablet Take 10 mg by mouth at bedtime.  . Omega-3 Fatty Acids (FISH OIL PO) Take 1 capsule by mouth 2 (two) times daily.   . tamsulosin (FLOMAX) 0.4 MG CAPS capsule Take 0.8 mg by mouth daily.   Marland Kitchen amLODipine (NORVASC) 10 MG tablet Take 10 mg by mouth daily.   No facility-administered encounter medications on file as of 05/26/2015.    Review of Systems  Constitutional: Negative for appetite change and unexpected weight change.  HENT: Negative for congestion and sinus pressure.   Respiratory: Negative for cough, chest tightness and shortness of breath.   Cardiovascular: Negative for chest pain, palpitations and leg swelling.  Gastrointestinal: Negative for nausea, vomiting, abdominal pain and diarrhea.  Genitourinary: Negative for dysuria and difficulty urinating.  Musculoskeletal: Negative for back pain.       Leg pain is better.    Skin: Negative for color change and rash.  Neurological: Negative for dizziness, light-headedness and headaches.  Psychiatric/Behavioral: Negative for dysphoric mood and agitation.       Objective:     Blood  pressure rechecked by me:  134-136/84  Physical Exam  Constitutional: He appears well-developed and well-nourished. No distress.  HENT:  Nose: Nose normal.  Mouth/Throat: Oropharynx is clear and moist.  Eyes: Conjunctivae are normal. Right eye exhibits no discharge. Left eye exhibits no discharge.  Neck: Neck supple. No thyromegaly present.  Cardiovascular: Normal rate and regular rhythm.   Pulmonary/Chest: Effort normal and breath sounds normal. No respiratory distress.  Abdominal: Soft. Bowel sounds are normal. There is no tenderness.  Musculoskeletal: He exhibits no edema or tenderness.  Lymphadenopathy:    He has no cervical adenopathy.  Skin: No rash noted. No erythema.  Psychiatric: He has a normal mood and  affect. His behavior is normal.    BP 139/87 mmHg  Pulse 59  Temp(Src) 97.9 F (36.6 C) (Oral)  Ht 5\' 7"  (1.702 m)  Wt 194 lb 8 oz (88.225 kg)  BMI 30.46 kg/m2  SpO2 97% Wt Readings from Last 3 Encounters:  05/26/15 194 lb 8 oz (88.225 kg)  03/09/15 198 lb 4 oz (89.926 kg)  03/05/15 192 lb 12.8 oz (87.454 kg)     Lab Results  Component Value Date   WBC 9.0 03/09/2015   HGB 11.9* 03/09/2015   HCT 36.0* 03/09/2015   PLT 210.0 03/09/2015   GLUCOSE 88 04/19/2015   CHOL 184 02/03/2015   TRIG 138.0 02/03/2015   HDL 35.00* 02/03/2015   LDLCALC 121* 02/03/2015   ALT 64* 05/26/2015   AST 58* 05/26/2015   NA 135 04/21/2015   K 4.4 04/19/2015   CL 95* 04/19/2015   CREATININE 1.60* 04/19/2015   BUN 19 04/19/2015   CO2 27 04/19/2015   TSH 3.032 03/04/2015   HGBA1C 5.5 11/22/2010       Assessment & Plan:   Problem List Items Addressed This Visit    Abnormal liver function tests    Had ultrasound at [facility].  States ok.  Had decided to have  Further f/u at the [place].  States they felt things were stable.  Will recheck today to confirm stable.  He desires no further intervention.        Anemia    Last hgb 11.9.  Follow.        Depression    Followed by psychiatry.  Stable.        Essential hypertension, benign    Blood pressure on the check in the office is ok.  Off amlodipine.  Remain off for now.  Follow pressures.  Follow metabolic panel.  States cr just checked by nephrology - 1.6.        Hypercholesterolemia    Low cholesterol diet and exercise.  Follow lipid panel.        Renal insufficiency    History of renal insufficiency.  Followed by nephrology.  Cr recently improved - 1.6.  Follow.        Rhabdomyolysis - Primary    Was recently admitted.  Hydrated.  Leg aching better.  Saw Dr Jefm Bryant.  Planning for MRI - thighs.  Being worked up for inflammatory muopathy.       Relevant Orders   Hepatic function panel (Completed)   CK (Creatine Kinase) (Completed)         Einar Pheasant, MD

## 2015-05-27 ENCOUNTER — Encounter: Payer: Self-pay | Admitting: Internal Medicine

## 2015-05-28 ENCOUNTER — Encounter: Payer: Self-pay | Admitting: Internal Medicine

## 2015-05-28 NOTE — Assessment & Plan Note (Signed)
Was recently admitted.  Hydrated.  Leg aching better.  Saw Dr Jefm Bryant.  Planning for MRI - thighs.  Being worked up for inflammatory muopathy.

## 2015-05-28 NOTE — Assessment & Plan Note (Signed)
Had ultrasound at [facility].  States ok.  Had decided to have  Further f/u at the [place].  States they felt things were stable.  Will recheck today to confirm stable.  He desires no further intervention.

## 2015-05-28 NOTE — Assessment & Plan Note (Signed)
Last hgb 11.9.  Follow.

## 2015-05-28 NOTE — Assessment & Plan Note (Signed)
History of renal insufficiency.  Followed by nephrology.  Cr recently improved - 1.6.  Follow.

## 2015-05-28 NOTE — Assessment & Plan Note (Signed)
Blood pressure on the check in the office is ok.  Off amlodipine.  Remain off for now.  Follow pressures.  Follow metabolic panel.  States cr just checked by nephrology - 1.6.

## 2015-05-28 NOTE — Assessment & Plan Note (Signed)
Low cholesterol diet and exercise.  Follow lipid panel.   

## 2015-05-28 NOTE — Assessment & Plan Note (Signed)
Followed by psychiatry.  Stable.  

## 2015-05-30 NOTE — Telephone Encounter (Signed)
Unread mychart message mailed to patient 

## 2015-06-15 LAB — HEPATIC FUNCTION PANEL
ALK PHOS: 57 U/L (ref 25–125)
BILIRUBIN DIRECT: 0.1 mg/dL
BILIRUBIN, TOTAL: 0.4 mg/dL

## 2015-06-15 LAB — HM HEPATITIS C SCREENING LAB: HM HEPATITIS C SCREENING: NEGATIVE

## 2015-06-21 IMAGING — CR DG CHEST 2V
1 series · 2 of 2 positions shown · non-contrast
Comparison: None. Patient's prior chest x-ray from 9666 is not
available for comparison

CLINICAL DATA: Cough

EXAM:
CHEST  2 VIEW

[Series 1: w chest pa · 0.14mm/px · 2 of 2 slices shown]
[im 1/2]
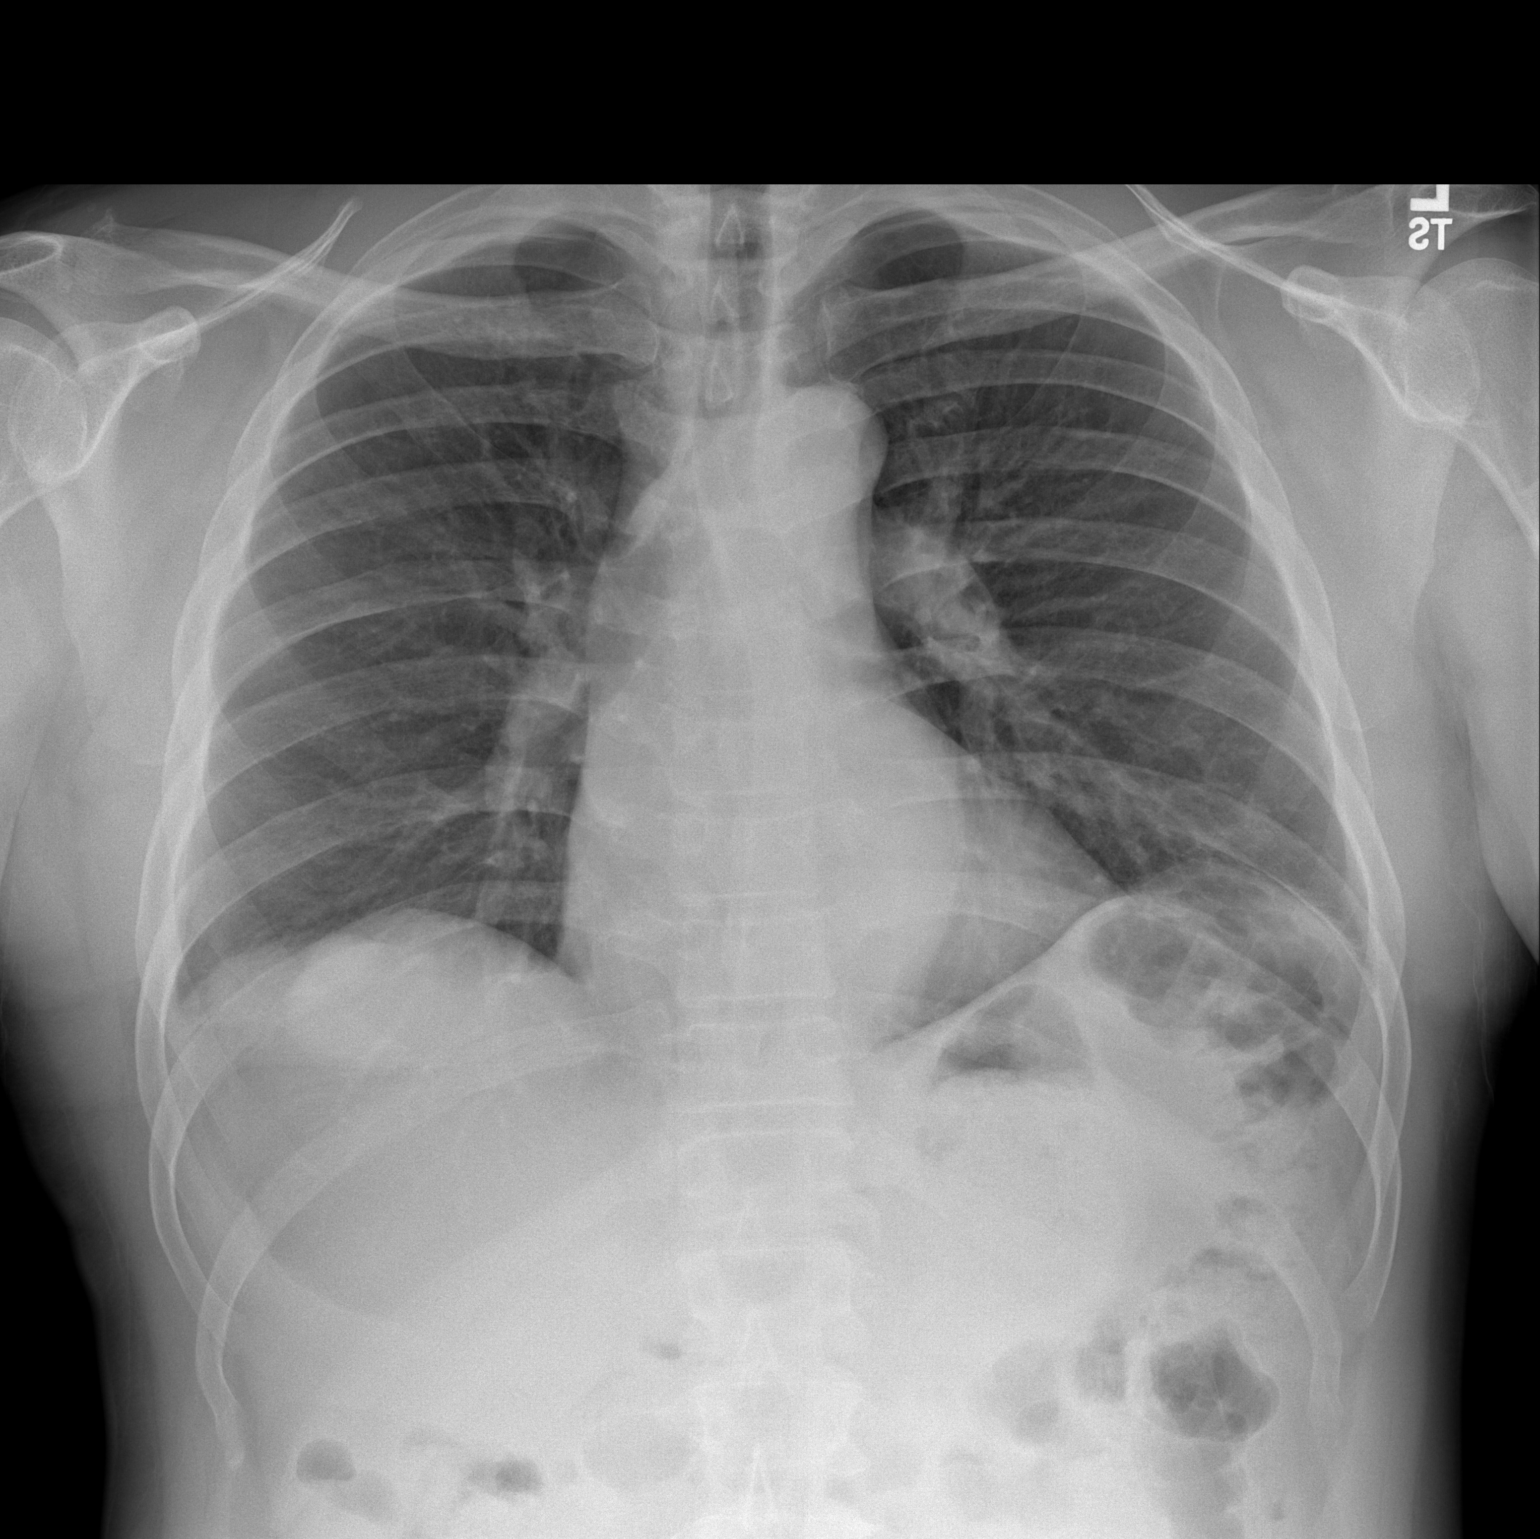
[im 2/2]
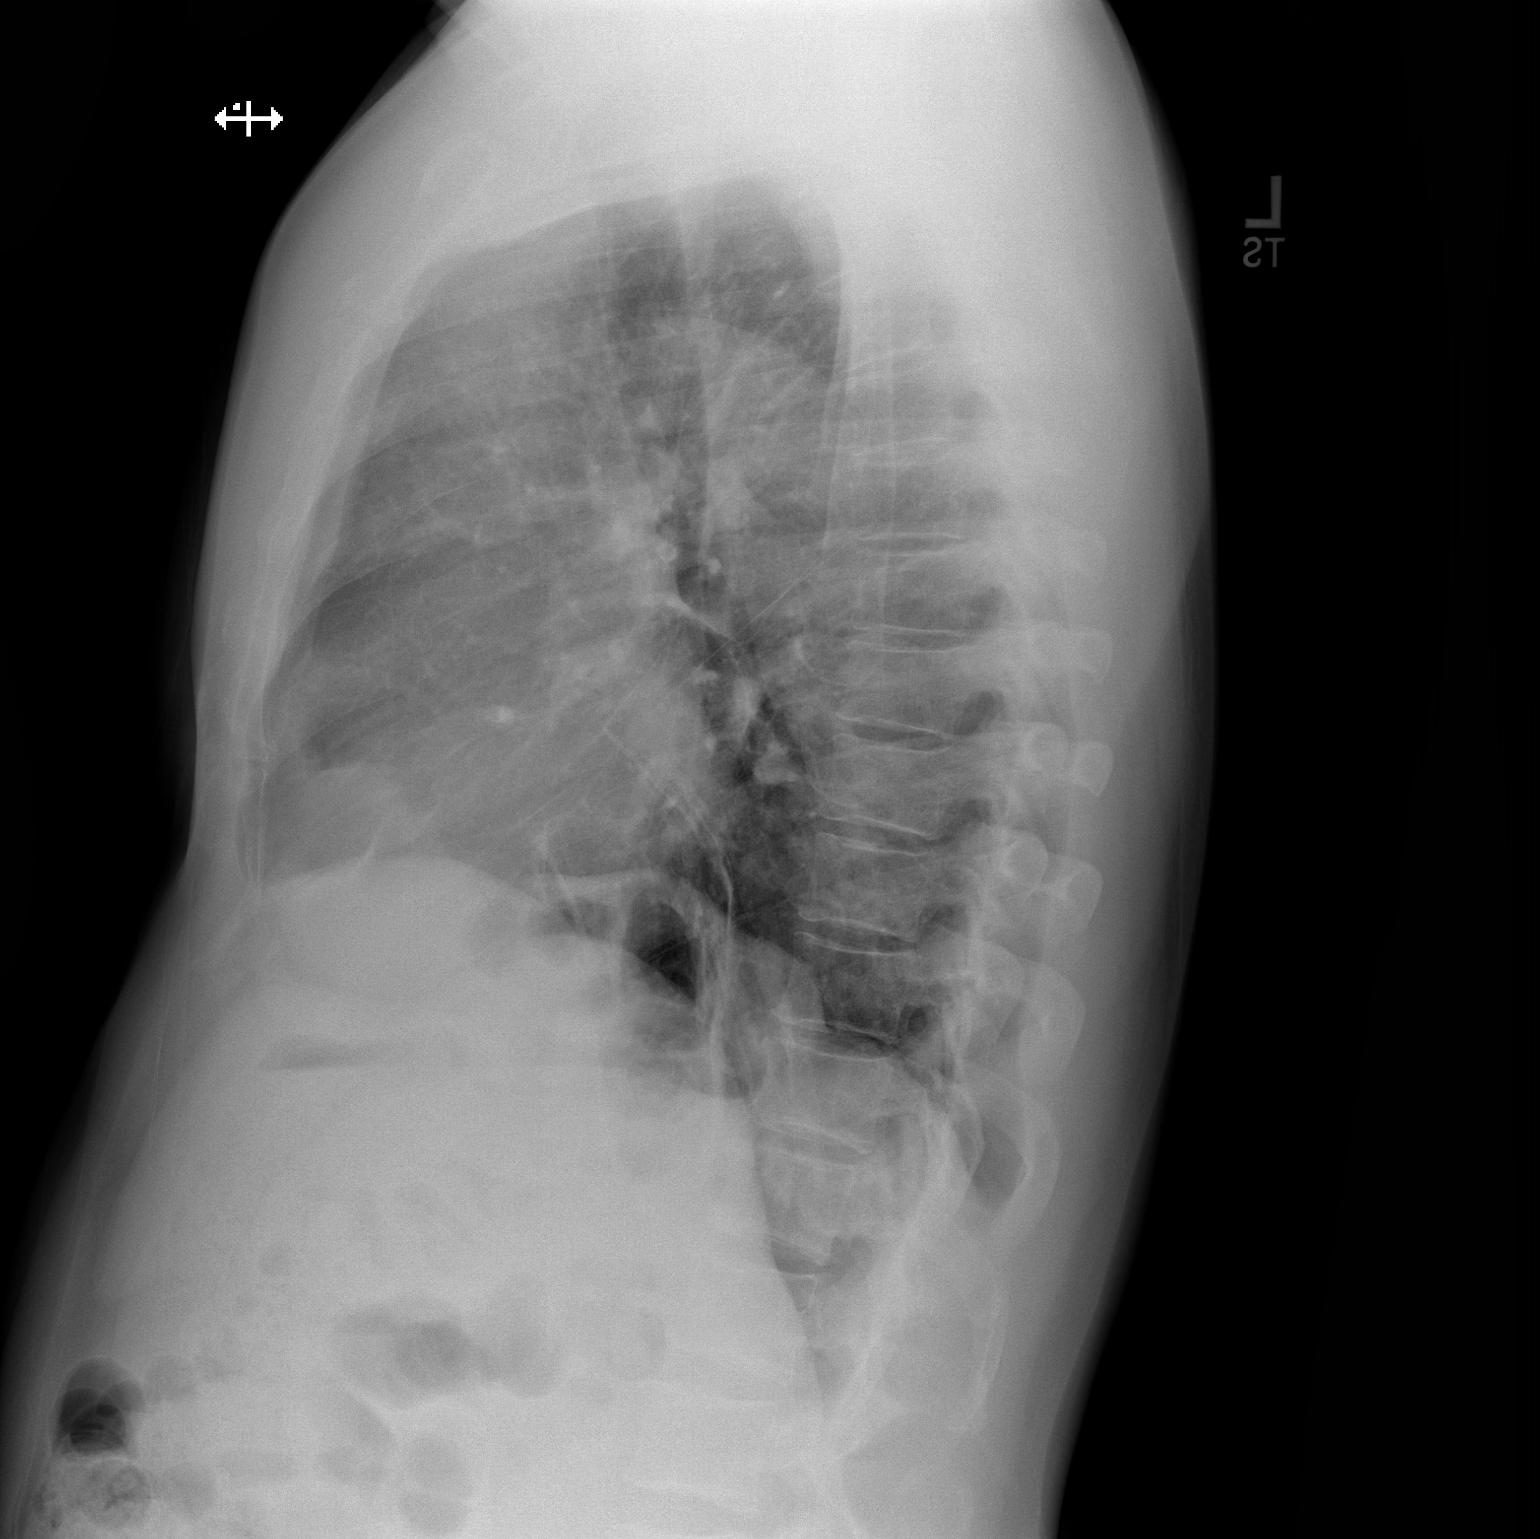

[2 of 2 positions shown; findings below may reference images not displayed]

FINDINGS: The heart size and mediastinal contours are within normal limits.
The aorta is tortuous. There is patchy consolidation of left lung
base. There is no pulmonary edema or pleural effusion. Beneath the
right hemidiaphragm is a somewhat rounded density. The visualized
skeletal structures are unremarkable.
IMPRESSION: Left lung base pneumonia. Question mass like density beneath the
right hemidiaphragm; further evaluation with CT on outpatient basis
recommended.

## 2015-06-30 ENCOUNTER — Encounter: Payer: Medicare Other | Admitting: Internal Medicine

## 2015-06-30 ENCOUNTER — Telehealth: Payer: Self-pay | Admitting: Internal Medicine

## 2015-06-30 NOTE — Telephone Encounter (Signed)
Pt called to inform that he got the MRI done 06/21/2015 by Dr Luther Redo at [facility]. He also saw a Rheumatologist at the [place] on 06/15/2015  and now they want to do a biopsy at the [facility]. Pt states he will call back with the results from the biopsy. Thank you!

## 2015-06-30 NOTE — Telephone Encounter (Signed)
Called and let him know to keep [place] posted

## 2015-06-30 NOTE — Telephone Encounter (Signed)
Notify him to keep [place] posted and let me know if he needs anything from [place].

## 2015-07-20 ENCOUNTER — Telehealth: Payer: Self-pay | Admitting: Internal Medicine

## 2015-07-20 NOTE — Telephone Encounter (Signed)
Pt called to let Dr Nicki Reaper know that he has his Biopsy scheduled for 07/28/2015 @8am . Thank You!

## 2015-07-20 NOTE — Telephone Encounter (Signed)
FYI

## 2015-07-21 ENCOUNTER — Encounter: Payer: Self-pay | Admitting: *Deleted

## 2015-07-21 NOTE — Telephone Encounter (Signed)
Mailed letter °

## 2015-07-21 NOTE — Telephone Encounter (Signed)
Ok.  Tell him to keep me posted.  Have them send me information.

## 2015-08-11 ENCOUNTER — Encounter: Payer: Self-pay | Admitting: Internal Medicine

## 2015-08-11 ENCOUNTER — Ambulatory Visit (INDEPENDENT_AMBULATORY_CARE_PROVIDER_SITE_OTHER): Payer: Medicare Other | Admitting: Internal Medicine

## 2015-08-11 VITALS — BP 120/82 | HR 56 | Temp 97.8°F | Resp 18 | Ht 67.0 in | Wt 195.0 lb

## 2015-08-11 DIAGNOSIS — I10 Essential (primary) hypertension: Secondary | ICD-10-CM

## 2015-08-11 DIAGNOSIS — F329 Major depressive disorder, single episode, unspecified: Secondary | ICD-10-CM

## 2015-08-11 DIAGNOSIS — R945 Abnormal results of liver function studies: Secondary | ICD-10-CM

## 2015-08-11 DIAGNOSIS — F32A Depression, unspecified: Secondary | ICD-10-CM

## 2015-08-11 DIAGNOSIS — N289 Disorder of kidney and ureter, unspecified: Secondary | ICD-10-CM | POA: Diagnosis not present

## 2015-08-11 DIAGNOSIS — R7989 Other specified abnormal findings of blood chemistry: Secondary | ICD-10-CM

## 2015-08-11 DIAGNOSIS — E7800 Pure hypercholesterolemia, unspecified: Secondary | ICD-10-CM | POA: Diagnosis not present

## 2015-08-11 DIAGNOSIS — M6282 Rhabdomyolysis: Secondary | ICD-10-CM | POA: Diagnosis not present

## 2015-08-11 NOTE — Progress Notes (Signed)
Patient ID: Ashmit Gambler, male   DOB: 26-Aug-1961, 54 y.o.   MRN: [id number]   Subjective:    Patient ID: [name], male    DOB: 10-01-1960, 54 y.o.   MRN: [id number]  HPI  Patient with past history of hypertension and depression who comes in today to follow up on these issues.  Recently hospitalized for rhabdo.  Also being worked up for abnormal liver function tests.  He was worked up at the [place] for his liver.  They felt it was the combination of the HCTZ and the lithium.  HCTZ was stopped.  Liver function tests on last check wnl.  His nephrologist placed him on furosemide for continued elevated blood pressure.  Just started the furosemide three days ago.  He has planned follow up in one month for blood pressure (with the nephrologist).  He is s/p muscle biopsy 07/28/15 - persistent elevated CK.  Is on prednisone now.  Starting to feel better.  Taking omeprazole since on prednisone.  Only minimal soreness now.  Overall feels better.  No cardiac symptoms with increased activity or exertion.  No sob.  No acid reflux.  Bowels stable.  Stress - good.  Doing well from a psych standpoint.     Past Medical History  Diagnosis Date  . Arthritis   . Depression     With psychotic features  . Hypertension   . Benign prostatic hypertrophy    Past Surgical History  Procedure Laterality Date  . Cyst excision Left 11/06/13    ganglion cyst removed left hand   Family History  Problem Relation Age of Onset  . Hypertension Mother   . Arthritis Father   . Hypertension Father   . Cancer Father     prostate cancer  . Diabetes Brother    Social History   Social History  . Marital Status: Married    Spouse Name: N/A  . Number of Children: N/A  . Years of Education: N/A   Social History Main Topics  . Smoking status: Former Smoker -- 2.00 packs/day for 15 years    Types: Cigarettes    Quit date: 09/23/1988  . Smokeless tobacco: Never Used  . Alcohol Use: No  . Drug Use: No  .  Sexual Activity: Not Asked   Other Topics Concern  . None   Social History Narrative    Outpatient Encounter Prescriptions as of 08/11/2015  Medication Sig  . aMILoride (MIDAMOR) 5 MG tablet Take 10 mg by mouth daily.   . Calcium Citrate-Vitamin D (CALCIUM + D PO) Take by mouth.  . carvedilol (COREG) 12.5 MG tablet Take 12.5 mg by mouth 2 (two) times daily with a meal.  . furosemide (LASIX) 20 MG tablet Take 20 mg by mouth.  . lithium carbonate 300 MG capsule Take 600 mg by mouth at bedtime.   Marland Kitchen OLANZapine (ZYPREXA) 10 MG tablet Take 10 mg by mouth at bedtime.  . Omega-3 Fatty Acids (FISH OIL PO) Take 1 capsule by mouth 2 (two) times daily.   Marland Kitchen omeprazole (PRILOSEC) 20 MG capsule Take 20 mg by mouth daily.  . predniSONE (DELTASONE) 10 MG tablet Take 10 mg by mouth 2 (two) times daily with a meal.  . tamsulosin (FLOMAX) 0.4 MG CAPS capsule Take 0.8 mg by mouth daily.   . [DISCONTINUED] amLODipine (NORVASC) 10 MG tablet Take 10 mg by mouth daily.   No facility-administered encounter medications on file as of 08/11/2015.    Review of Systems  Constitutional: Negative for appetite change and unexpected weight change.  HENT: Negative for congestion and sinus pressure.   Respiratory: Negative for cough, chest tightness and shortness of breath.   Cardiovascular: Negative for chest pain, palpitations and leg swelling.  Gastrointestinal: Negative for nausea, vomiting, abdominal pain and diarrhea.  Genitourinary: Negative for dysuria and difficulty urinating.  Musculoskeletal: Negative for back pain and joint swelling.       Leg pain is better.  On prednisone now.   Skin: Negative for color change and rash.  Neurological: Negative for dizziness, light-headedness and headaches.  Psychiatric/Behavioral: Negative for dysphoric mood and agitation.       Objective:   blood pressure rechecked by me:  138/84  Physical Exam  Constitutional: He appears well-developed and well-nourished. No  distress.  HENT:  Nose: Nose normal.  Mouth/Throat: Oropharynx is clear and moist. No oropharyngeal exudate.  Eyes: Conjunctivae are normal. Right eye exhibits no discharge. Left eye exhibits no discharge.  Neck: Neck supple. No thyromegaly present.  Cardiovascular: Normal rate and regular rhythm.   Pulmonary/Chest: Effort normal and breath sounds normal. No respiratory distress.  Abdominal: Soft. Bowel sounds are normal. There is no tenderness.  Musculoskeletal: He exhibits no edema or tenderness.  Lymphadenopathy:    He has no cervical adenopathy.  Skin: No rash noted. No erythema.  Psychiatric: He has a normal mood and affect. His behavior is normal.    BP 120/82 mmHg  Pulse 56  Temp(Src) 97.8 F (36.6 C) (Oral)  Resp 18  Ht 5\' 7"  (1.702 m)  Wt 195 lb (88.451 kg)  BMI 30.53 kg/m2  SpO2 98% Wt Readings from Last 3 Encounters:  08/11/15 195 lb (88.451 kg)  05/26/15 194 lb 8 oz (88.225 kg)  03/09/15 198 lb 4 oz (89.926 kg)     Lab Results  Component Value Date   WBC 9.0 03/09/2015   HGB 11.9* 03/09/2015   HCT 36.0* 03/09/2015   PLT 210.0 03/09/2015   GLUCOSE 88 04/19/2015   CHOL 184 02/03/2015   TRIG 138.0 02/03/2015   HDL 35.00* 02/03/2015   LDLCALC 121* 02/03/2015   ALT 64* 05/26/2015   AST 58* 05/26/2015   NA 135 04/21/2015   K 4.4 04/19/2015   CL 95* 04/19/2015   CREATININE 1.60* 04/19/2015   BUN 19 04/19/2015   CO2 27 04/19/2015   TSH 3.032 03/04/2015   HGBA1C 5.5 11/22/2010       Assessment & Plan:   Problem List Items Addressed This Visit    Abnormal liver function tests    Worked up at the [place].  Felt to be related to the Lithium and HCTZ.   Off hctz now.  Last liver panel check wnl.  Follow.        Depression    Followed by psychiatry.  Stable.  Feels he is doing well.        Essential hypertension, benign - Primary    Blood pressure as outlined.  Recently started on furosemide.  Following with nephrology.  Follow pressures.         Relevant Medications   furosemide (LASIX) 20 MG tablet   Hypercholesterolemia    Low cholesterol diet and exercise.  Follow lipid panel.  Will have cholesterol checked with labs at the [place].        Relevant Medications   furosemide (LASIX) 20 MG tablet   Renal insufficiency    History of renal insufficiency.  Followed by nephrology.  Rhabdomyolysis    Admitted.  Hydrated.  Persistent elevated CK.  Saw rheumatology.  S/p muscle biopsy.  On prednisone.  Planning f/u soon to discuss biopsy results.            Einar Pheasant, MD

## 2015-08-11 NOTE — Progress Notes (Signed)
Pre-visit discussion using our clinic review tool. No additional management support is needed unless otherwise documented below in the visit note.  

## 2015-08-13 ENCOUNTER — Encounter: Payer: Self-pay | Admitting: Internal Medicine

## 2015-08-13 NOTE — Assessment & Plan Note (Signed)
Blood pressure as outlined.  Recently started on furosemide.  Following with nephrology.  Follow pressures.

## 2015-08-13 NOTE — Assessment & Plan Note (Signed)
Followed by psychiatry.  Stable.  Feels he is doing well.

## 2015-08-13 NOTE — Assessment & Plan Note (Addendum)
Low cholesterol diet and exercise.  Follow lipid panel.  Will have cholesterol checked with labs at the [place].

## 2015-08-13 NOTE — Assessment & Plan Note (Signed)
History of renal insufficiency.  Followed by nephrology.

## 2015-08-13 NOTE — Assessment & Plan Note (Signed)
Admitted.  Hydrated.  Persistent elevated CK.  Saw rheumatology.  S/p muscle biopsy.  On prednisone.  Planning f/u soon to discuss biopsy results.

## 2015-08-13 NOTE — Assessment & Plan Note (Signed)
Worked up at the [place].  Felt to be related to the Lithium and HCTZ.   Off hctz now.  Last liver panel check wnl.  Follow.

## 2015-08-16 LAB — HEPATIC FUNCTION PANEL
ALT: 55 U/L — AB (ref 10–40)
AST: 17 U/L (ref 14–40)

## 2015-08-20 ENCOUNTER — Encounter: Payer: Self-pay | Admitting: Internal Medicine

## 2015-08-20 DIAGNOSIS — M3320 Polymyositis, organ involvement unspecified: Secondary | ICD-10-CM | POA: Insufficient documentation

## 2015-08-24 ENCOUNTER — Telehealth: Payer: Self-pay | Admitting: *Deleted

## 2015-08-24 NOTE — Telephone Encounter (Signed)
LM for patient to call back.

## 2015-08-24 NOTE — Telephone Encounter (Signed)
Patient had concerns that his biopsy was faxed over from [facility]. Patient requested the results and that the results be faxed over to his [place] doctor. Dr Gorden Harms , fax [phone].

## 2015-08-24 NOTE — Telephone Encounter (Signed)
Spoke with patient and he is going to contact [place] about medical records.

## 2015-08-25 ENCOUNTER — Ambulatory Visit: Payer: Medicare Other | Admitting: Internal Medicine

## 2015-08-28 NOTE — Telephone Encounter (Signed)
Patient stated that signed the medical release form from [facility], he wanted to know if his biopsy results were faxed over. Patient stated that he will fax over his lab results this afternoon. Please Advise

## 2015-08-29 ENCOUNTER — Encounter: Payer: Self-pay | Admitting: Internal Medicine

## 2015-08-29 NOTE — Telephone Encounter (Signed)
Received labs & a comment stating that muscle biopsy results were not back yet.

## 2015-08-29 NOTE — Telephone Encounter (Signed)
Labs placed in greens folder

## 2015-08-30 ENCOUNTER — Ambulatory Visit: Payer: Medicare Other | Admitting: Internal Medicine

## 2015-08-30 NOTE — Telephone Encounter (Signed)
I reviewed labs.  Per note on the labs, he has already been notified of lab results with instructions regarding his prednisone.  Per note, liver biopsy not received.

## 2015-08-30 NOTE — Telephone Encounter (Signed)
Pt.notified

## 2015-09-12 ENCOUNTER — Telehealth: Payer: Self-pay | Admitting: Internal Medicine

## 2015-09-12 NOTE — Telephone Encounter (Signed)
Pt called to check if his Biopsy results are in from [facility] pt had the biopsy done on 07/28/2015. Pt was told that the biopsy was going to be sent here. Thank You!

## 2015-09-12 NOTE — Telephone Encounter (Signed)
Report received & placed in green folder

## 2015-09-12 NOTE — Telephone Encounter (Signed)
Have we received these? Thanks

## 2015-09-13 NOTE — Telephone Encounter (Signed)
Notify pt that we received muscle biopsy results.  He had already been placed on prednisone.  Please confirm he is continuing to f/u with rheumatology for treatment and management of prednisone therapy.

## 2015-09-15 NOTE — Telephone Encounter (Signed)
Pt.notified

## 2015-12-14 ENCOUNTER — Encounter: Payer: Self-pay | Admitting: Internal Medicine

## 2015-12-14 ENCOUNTER — Ambulatory Visit (INDEPENDENT_AMBULATORY_CARE_PROVIDER_SITE_OTHER): Payer: Medicare Other | Admitting: Internal Medicine

## 2015-12-14 VITALS — BP 130/90 | HR 57 | Temp 97.6°F | Resp 17 | Ht 67.0 in | Wt 220.1 lb

## 2015-12-14 DIAGNOSIS — R938 Abnormal findings on diagnostic imaging of other specified body structures: Secondary | ICD-10-CM

## 2015-12-14 DIAGNOSIS — M3320 Polymyositis, organ involvement unspecified: Secondary | ICD-10-CM | POA: Diagnosis not present

## 2015-12-14 DIAGNOSIS — M791 Myalgia, unspecified site: Secondary | ICD-10-CM

## 2015-12-14 DIAGNOSIS — R7989 Other specified abnormal findings of blood chemistry: Secondary | ICD-10-CM

## 2015-12-14 DIAGNOSIS — E7800 Pure hypercholesterolemia, unspecified: Secondary | ICD-10-CM

## 2015-12-14 DIAGNOSIS — R945 Abnormal results of liver function studies: Secondary | ICD-10-CM

## 2015-12-14 DIAGNOSIS — I10 Essential (primary) hypertension: Secondary | ICD-10-CM | POA: Diagnosis not present

## 2015-12-14 DIAGNOSIS — R9389 Abnormal findings on diagnostic imaging of other specified body structures: Secondary | ICD-10-CM

## 2015-12-14 DIAGNOSIS — F32A Depression, unspecified: Secondary | ICD-10-CM

## 2015-12-14 DIAGNOSIS — N289 Disorder of kidney and ureter, unspecified: Secondary | ICD-10-CM

## 2015-12-14 DIAGNOSIS — F329 Major depressive disorder, single episode, unspecified: Secondary | ICD-10-CM

## 2015-12-14 NOTE — Progress Notes (Signed)
Patient ID: Donald Small, male   DOB: 1961-08-28, 55 y.o.   MRN: [id number]   Subjective:    Patient ID: Donald Small, male    DOB: 1961-07-17, 55 y.o.   MRN: [id number]  HPI  Patient here for a scheduled follow up.  Has been diagnosed with polymyositis. On prednisone.  States does feel better.  Still with some muscle aching.  Followed by rheumatology.  Tries to stay active.  Has not been able to exercise recently.  Plans to start.  Has gained weight since starting prednisone.  States gets sob when bends over.  Feels related to the weight gain.  Eating.  No nausea or vomiting.  Bowels stable.  States blood pressure has been elevated on the prednisone.  Blood pressure averaging 140s/90s.  Seeing nephrology.  Discussed previous abnormal chest CT.  Discussed the need for f/u CT.  No cough or congestion.     Past Medical History  Diagnosis Date  . Arthritis   . Depression     With psychotic features  . Hypertension   . Benign prostatic hypertrophy    Past Surgical History  Procedure Laterality Date  . Cyst excision Left 11/06/13    ganglion cyst removed left hand   Family History  Problem Relation Age of Onset  . Hypertension Mother   . Arthritis Father   . Hypertension Father   . Cancer Father     prostate cancer  . Diabetes Brother    Social History   Social History  . Marital Status: Married    Spouse Name: N/A  . Number of Children: N/A  . Years of Education: N/A   Social History Main Topics  . Smoking status: Former Smoker -- 2.00 packs/day for 15 years    Types: Cigarettes    Quit date: 09/23/1988  . Smokeless tobacco: Never Used  . Alcohol Use: No  . Drug Use: No  . Sexual Activity: Not Asked   Other Topics Concern  . None   Social History Narrative    Outpatient Encounter Prescriptions as of 12/14/2015  Medication Sig  . aMILoride (MIDAMOR) 5 MG tablet Take 10 mg by mouth daily.   . Calcium Citrate-Vitamin D (CALCIUM + D PO) Take by mouth.    . carvedilol (COREG) 12.5 MG tablet Take 12.5 mg by mouth 2 (two) times daily with a meal.  . FOLIC ACID PO Take by mouth.  . furosemide (LASIX) 20 MG tablet Take 20 mg by mouth.  . lithium carbonate 300 MG capsule Take 600 mg by mouth at bedtime.   . methotrexate (RHEUMATREX) 2.5 MG tablet Take 7.5 mg by mouth once a week. Take 6 tablets every Saturday  . OLANZapine (ZYPREXA) 10 MG tablet Take 10 mg by mouth at bedtime.  . Omega-3 Fatty Acids (FISH OIL PO) Take 1 capsule by mouth 2 (two) times daily.   Marland Kitchen omeprazole (PRILOSEC) 20 MG capsule Take 20 mg by mouth daily.  Marland Kitchen PREDNISONE PO Take 7.5 mg by mouth daily.  . tamsulosin (FLOMAX) 0.4 MG CAPS capsule Take 0.8 mg by mouth daily.   . [DISCONTINUED] predniSONE (DELTASONE) 10 MG tablet Take 10 mg by mouth 2 (two) times daily with a meal.   No facility-administered encounter medications on file as of 12/14/2015.    Review of Systems  Constitutional:       Increased weight gain.  On prednisone.  Not exercising.    HENT: Negative for congestion and sinus pressure.   Respiratory: Negative  for cough, chest tightness and shortness of breath.   Cardiovascular: Negative for chest pain, palpitations and leg swelling.  Gastrointestinal: Negative for nausea, vomiting, abdominal pain and diarrhea.  Genitourinary: Negative for dysuria and difficulty urinating.  Musculoskeletal: Negative for joint swelling.       Muscle aches as outlined.  Involving legs.   Skin: Negative for color change and rash.  Neurological: Negative for dizziness, light-headedness and headaches.  Psychiatric/Behavioral: Negative for dysphoric mood and agitation.       Objective:     Blood pressure rechecked by me:  138/84  Physical Exam  Constitutional: He appears well-developed and well-nourished. No distress.  HENT:  Nose: Nose normal.  Mouth/Throat: Oropharynx is clear and moist.  Neck: Neck supple. No thyromegaly present.  Cardiovascular: Normal rate and regular  rhythm.   Pulmonary/Chest: Effort normal and breath sounds normal. No respiratory distress.  Abdominal: Soft. Bowel sounds are normal. There is no tenderness.  Musculoskeletal: He exhibits no edema or tenderness.  Lymphadenopathy:    He has no cervical adenopathy.  Skin: No rash noted. No erythema.  Psychiatric: He has a normal mood and affect. His behavior is normal.    BP 130/90 mmHg  Pulse 57  Temp(Src) 97.6 F (36.4 C) (Oral)  Resp 17  Ht 5\' 7"  (1.702 m)  Wt 220 lb 2 oz (99.848 kg)  BMI 34.47 kg/m2  SpO2 98% Wt Readings from Last 3 Encounters:  12/14/15 220 lb 2 oz (99.848 kg)  08/11/15 195 lb (88.451 kg)  05/26/15 194 lb 8 oz (88.225 kg)     Lab Results  Component Value Date   WBC 9.0 03/09/2015   HGB 11.9* 03/09/2015   HCT 36.0* 03/09/2015   PLT 210.0 03/09/2015   GLUCOSE 88 04/19/2015   CHOL 184 02/03/2015   TRIG 138.0 02/03/2015   HDL 35.00* 02/03/2015   LDLCALC 121* 02/03/2015   ALT 55* 08/16/2015   AST 17 08/16/2015   NA 135 04/21/2015   K 4.4 04/19/2015   CL 95* 04/19/2015   CREATININE 1.60* 04/19/2015   BUN 19 04/19/2015   CO2 27 04/19/2015   TSH 3.032 03/04/2015   HGBA1C 5.5 11/22/2010       Assessment & Plan:   Problem List Items Addressed This Visit    Abnormal CT of the chest    CT chest as outlined.  Discussed with him today.  He is not sure ever had f/u CT.  Needs.  Wants done at [facility].  States discuss with Dr Luther Redo [phone].        Abnormal liver function tests    Worked up at the [place].  Felt to be related to lithium and hctz.  Off hctz.  Follow liver panel.  States just had labs checked a few weeks ago.  Obtain labs.        Depression    Followed by psychiatry.  Feels stable.        Essential hypertension, benign    Blood pressure as outlined.  Tapering prednisone.  Blood pressure doing better.  Continue current medication regimen.  Follow pressures.  Follow metabolic panel.        Hypercholesterolemia    Low cholesterol  diet and exercise.  Follow lipid panel.        Muscle ache    Does feel doing better.  Still with some aching.  Followed by rheumatology.  Tapering prednisone.       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  Seeing rheumatology.  On prednisone.  States feels better. Still with some muscle aches.  Continue f/u with rheumatology.        Renal insufficiency    Followed by nephrology.  Avoid nephrotoxic agents.            Einar Pheasant, MD

## 2015-12-14 NOTE — Progress Notes (Signed)
Pre-visit discussion using our clinic review tool. No additional management support is needed unless otherwise documented below in the visit note.  

## 2015-12-16 ENCOUNTER — Encounter: Payer: Self-pay | Admitting: Internal Medicine

## 2015-12-16 NOTE — Assessment & Plan Note (Signed)
Blood pressure as outlined.  Tapering prednisone.  Blood pressure doing better.  Continue current medication regimen.  Follow pressures.  Follow metabolic panel.

## 2015-12-16 NOTE — Assessment & Plan Note (Signed)
Does feel doing better.  Still with some aching.  Followed by rheumatology.  Tapering prednisone.

## 2015-12-16 NOTE — Assessment & Plan Note (Signed)
Low cholesterol diet and exercise.  Follow lipid panel.   

## 2015-12-16 NOTE — Assessment & Plan Note (Signed)
Followed by nephrology.  Avoid nephrotoxic agents.

## 2015-12-16 NOTE — Assessment & Plan Note (Signed)
Muscle biopsy 07/28/15 c/w polymyositis.  Seeing rheumatology.  On prednisone.  States feels better. Still with some muscle aches.  Continue f/u with rheumatology.

## 2015-12-16 NOTE — Assessment & Plan Note (Signed)
Worked up at the [place].  Felt to be related to lithium and hctz.  Off hctz.  Follow liver panel.  States just had labs checked a few weeks ago.  Obtain labs.

## 2015-12-16 NOTE — Assessment & Plan Note (Signed)
Followed by psychiatry.  Feels stable.

## 2015-12-16 NOTE — Assessment & Plan Note (Signed)
CT chest as outlined.  Discussed with him today.  He is not sure ever had f/u CT.  Needs.  Wants done at [facility].  States discuss with Dr Luther Redo [phone].

## 2015-12-18 ENCOUNTER — Telehealth: Payer: Self-pay

## 2015-12-18 NOTE — Telephone Encounter (Signed)
Pt is requesting what the next thing you want him to do, he states that he can not get copies of chest xrays from 2004. Please advise, thanks

## 2015-12-19 NOTE — Telephone Encounter (Signed)
Noted  

## 2015-12-19 NOTE — Telephone Encounter (Signed)
I will try to contact his physician at [facility] about ordering the CT scan.

## 2015-12-25 ENCOUNTER — Encounter: Payer: Self-pay | Admitting: Internal Medicine

## 2016-03-07 LAB — LIPID PANEL
Cholesterol: 226 mg/dL — AB (ref 0–200)
HDL: 33 mg/dL — AB (ref 35–70)
LDL CALC: 147 mg/dL
TRIGLYCERIDES: 232 mg/dL — AB (ref 40–160)

## 2016-03-19 ENCOUNTER — Ambulatory Visit: Payer: Medicare Other | Admitting: Internal Medicine

## 2016-03-27 ENCOUNTER — Telehealth: Payer: Self-pay | Admitting: Internal Medicine

## 2016-03-27 NOTE — Telephone Encounter (Signed)
Called and spoke to pt regarding follow CT chest.  He wanted to get through [place].  I have called a couple of times.  Left message with Dr Luther Redo.  Called pt today because have not heard about f/u and if f/u needed.  He has discussed with them as well.  He will f/u and let me know if I need to talk with someone or if needs f/u.

## 2016-03-28 ENCOUNTER — Encounter: Payer: Self-pay | Admitting: Internal Medicine

## 2016-03-31 ENCOUNTER — Telehealth: Payer: Self-pay | Admitting: Internal Medicine

## 2016-03-31 NOTE — Telephone Encounter (Signed)
Pt notified of outside cholesterol labs via my chart.

## 2016-04-03 NOTE — Telephone Encounter (Signed)
Unread mychart message mailed to patient 

## 2016-04-23 ENCOUNTER — Telehealth: Payer: Self-pay | Admitting: Internal Medicine

## 2016-04-23 NOTE — Telephone Encounter (Signed)
Placed in green folder 

## 2016-04-23 NOTE — Telephone Encounter (Signed)
Pt dropped off chest xray and letter.. Will pick up at his Sept at appt.. Please advise pt.Donald Small to [employer]

## 2016-04-24 NOTE — Telephone Encounter (Signed)
CT being followed at [facility].  Hold for f/u appt per pt request.

## 2016-05-24 ENCOUNTER — Ambulatory Visit (INDEPENDENT_AMBULATORY_CARE_PROVIDER_SITE_OTHER): Payer: Medicare Other | Admitting: Internal Medicine

## 2016-05-24 ENCOUNTER — Encounter: Payer: Self-pay | Admitting: Internal Medicine

## 2016-05-24 DIAGNOSIS — F32A Depression, unspecified: Secondary | ICD-10-CM

## 2016-05-24 DIAGNOSIS — E7800 Pure hypercholesterolemia, unspecified: Secondary | ICD-10-CM | POA: Diagnosis not present

## 2016-05-24 DIAGNOSIS — I10 Essential (primary) hypertension: Secondary | ICD-10-CM

## 2016-05-24 DIAGNOSIS — R9389 Abnormal findings on diagnostic imaging of other specified body structures: Secondary | ICD-10-CM

## 2016-05-24 DIAGNOSIS — M3320 Polymyositis, organ involvement unspecified: Secondary | ICD-10-CM | POA: Diagnosis not present

## 2016-05-24 DIAGNOSIS — R938 Abnormal findings on diagnostic imaging of other specified body structures: Secondary | ICD-10-CM

## 2016-05-24 DIAGNOSIS — D649 Anemia, unspecified: Secondary | ICD-10-CM

## 2016-05-24 DIAGNOSIS — R945 Abnormal results of liver function studies: Secondary | ICD-10-CM

## 2016-05-24 DIAGNOSIS — N289 Disorder of kidney and ureter, unspecified: Secondary | ICD-10-CM

## 2016-05-24 DIAGNOSIS — R7989 Other specified abnormal findings of blood chemistry: Secondary | ICD-10-CM

## 2016-05-24 DIAGNOSIS — F329 Major depressive disorder, single episode, unspecified: Secondary | ICD-10-CM

## 2016-05-24 NOTE — Progress Notes (Signed)
Patient ID: Donald Small, male   DOB: 1961/04/21, 55 y.o.   MRN: [id number]   Subjective:    Patient ID: Donald Small, male    DOB: 12-08-1960, 55 y.o.   MRN: [id number]  HPI  Patient here for a scheduled follow up.  Off prednisone.  Stopped 01/22/16.  Is exercising.  Weight is down.  Breathing better.  Feels better.  No sob.  No increased cough or congestion.  No chest pain.  No abdominal pain or cramping.  Bowels stable.  No urine change reported.  Had f/u cxr with PCP at [facility].  States does not require further w/up.  Blood pressure elevated recently.  Amlodipine added recently.  Blood pressure doing better. Seeing hematology and psychiatry at the [place].   Just had labs.  States everything checked out fine.     Past Medical History:  Diagnosis Date  . Arthritis   . Benign prostatic hypertrophy   . Depression    With psychotic features  . Hypertension    Past Surgical History:  Procedure Laterality Date  . CYST EXCISION Left 11/06/13   ganglion cyst removed left hand   Family History  Problem Relation Age of Onset  . Hypertension Mother   . Arthritis Father   . Hypertension Father   . Cancer Father     prostate cancer  . Diabetes Brother    Social History   Social History  . Marital status: Married    Spouse name: N/A  . Number of children: N/A  . Years of education: N/A   Social History Main Topics  . Smoking status: Former Smoker    Packs/day: 2.00    Years: 15.00    Types: Cigarettes    Quit date: 09/23/1988  . Smokeless tobacco: Never Used  . Alcohol use No  . Drug use: No  . Sexual activity: Not Asked   Other Topics Concern  . None   Social History Narrative  . None    Outpatient Encounter Prescriptions as of 05/24/2016  Medication Sig  . aMILoride (MIDAMOR) 5 MG tablet Take 10 mg by mouth daily.   Marland Kitchen amLODipine (NORVASC) 5 MG tablet Take 5 mg by mouth daily.  . Calcium Citrate-Vitamin D (CALCIUM + D PO) Take by mouth.  . carvedilol (COREG) 12.5  MG tablet Take 12.5 mg by mouth 2 (two) times daily with a meal.  . FOLIC ACID PO Take by mouth.  . furosemide (LASIX) 20 MG tablet Take 20 mg by mouth.  . lithium carbonate 300 MG capsule Take 600 mg by mouth at bedtime.   . methotrexate (RHEUMATREX) 2.5 MG tablet Take 7.5 mg by mouth once a week. Take 6 tablets every Saturday  . OLANZapine (ZYPREXA) 10 MG tablet Take 10 mg by mouth at bedtime.  . Omega-3 Fatty Acids (FISH OIL PO) Take 1 capsule by mouth 2 (two) times daily.   . tamsulosin (FLOMAX) 0.4 MG CAPS capsule Take 0.8 mg by mouth daily.   . [DISCONTINUED] omeprazole (PRILOSEC) 20 MG capsule Take 20 mg by mouth daily.  . [DISCONTINUED] PREDNISONE PO Take 7.5 mg by mouth daily.   No facility-administered encounter medications on file as of 05/24/2016.     Review of Systems  Constitutional: Negative for appetite change and unexpected weight change.       Overall feels better.  Has lost weight.  Off prednisone.    HENT: Negative for congestion and sinus pressure.   Respiratory: Negative for cough, chest tightness and  shortness of breath.   Cardiovascular: Negative for chest pain, palpitations and leg swelling.  Gastrointestinal: Negative for abdominal pain, diarrhea, nausea and vomiting.  Genitourinary: Negative for difficulty urinating and dysuria.  Musculoskeletal: Negative for back pain and joint swelling.  Skin: Negative for color change and rash.  Neurological: Negative for dizziness, light-headedness and headaches.  Psychiatric/Behavioral: Negative for agitation and dysphoric mood.       Objective:     Blood pressure rechecked by me:  132/80-82  Physical Exam  Constitutional: He appears well-developed and well-nourished. No distress.  HENT:  Nose: Nose normal.  Mouth/Throat: Oropharynx is clear and moist.  Neck: Neck supple. No thyromegaly present.  Cardiovascular: Normal rate and regular rhythm.   Pulmonary/Chest: Effort normal and breath sounds normal. No  respiratory distress.  Abdominal: Soft. Bowel sounds are normal. There is no tenderness.  Musculoskeletal: He exhibits no edema or tenderness.  Lymphadenopathy:    He has no cervical adenopathy.  Skin: No rash noted. No erythema.  Psychiatric: He has a normal mood and affect. His behavior is normal.    BP 132/80   Pulse (!) 59   Temp 97.9 F (36.6 C) (Oral)   Resp 18   Ht 5\' 7"  (1.702 m)   Wt 207 lb 4 oz (94 kg)   SpO2 96%   BMI 32.46 kg/m  Wt Readings from Last 3 Encounters:  05/24/16 207 lb 4 oz (94 kg)  12/14/15 220 lb 2 oz (99.8 kg)  08/11/15 195 lb (88.5 kg)     Lab Results  Component Value Date   WBC 9.0 03/09/2015   HGB 11.9 (L) 03/09/2015   HCT 36.0 (L) 03/09/2015   PLT 210.0 03/09/2015   GLUCOSE 88 04/19/2015   CHOL 226 (A) 03/07/2016   TRIG 232 (A) 03/07/2016   HDL 33 (A) 03/07/2016   LDLCALC 147 03/07/2016   ALT 55 (A) 08/16/2015   AST 17 08/16/2015   NA 135 04/21/2015   K 4.4 04/19/2015   CL 95 (L) 04/19/2015   CREATININE 1.60 (H) 04/19/2015   BUN 19 04/19/2015   CO2 27 04/19/2015   TSH 3.032 03/04/2015   HGBA1C 5.5 11/22/2010       Assessment & Plan:   Problem List Items Addressed This Visit    Abnormal CT of the chest    CT 2015 as outlined.  Had f/u cxr at the [place].  States no further w/up warranted.  Breathing better.  Follow.       Abnormal liver function tests    Worked up at the [place].  Felt to be related to lithium and hctz.  Off hctz.  States last liver function tests ok.  Follow liver panel.  Obtain results of recent labs.        Anemia    Will need to follow cbc.       Depression    Followed by psychiatry.  Stable.       Essential hypertension, benign    Blood pressure doing better with the addition of amlodipine.  Follow pressures.  Continue same medication regimen.        Relevant Medications   amLODipine (NORVASC) 5 MG tablet   Hypercholesterolemia    Low cholesterol diet and exercise.  Follow lipid panel.  Avoid statin  medication given elevated liver function tests.        Relevant Medications   amLODipine (NORVASC) 5 MG tablet   Polymyositis ([facility])    S/p muscle biopsy.  C/w polymyositis.  Seeing rheumatology.  Off prednisone now.  Has lost weight.  Feels better.  Breathing better.        Renal insufficiency    Followed by nephrology.  States just had labs checked.  Obtain results.         Other Visit Diagnoses   None.      Einar Pheasant, MD

## 2016-05-24 NOTE — Progress Notes (Signed)
Pre-visit discussion using our clinic review tool. No additional management support is needed unless otherwise documented below in the visit note.  

## 2016-05-25 ENCOUNTER — Encounter: Payer: Self-pay | Admitting: Internal Medicine

## 2016-05-25 NOTE — Assessment & Plan Note (Signed)
CT 2015 as outlined.  Had f/u cxr at the [place].  States no further w/up warranted.  Breathing better.  Follow.

## 2016-05-25 NOTE — Assessment & Plan Note (Signed)
S/p muscle biopsy.  C/w polymyositis.  Seeing rheumatology.  Off prednisone now.  Has lost weight.  Feels better.  Breathing better.

## 2016-05-25 NOTE — Assessment & Plan Note (Signed)
Followed by nephrology.  States just had labs checked.  Obtain results.

## 2016-05-25 NOTE — Assessment & Plan Note (Signed)
Followed by psychiatry.  Stable.  

## 2016-05-25 NOTE — Assessment & Plan Note (Signed)
Low cholesterol diet and exercise.  Follow lipid panel.  Avoid statin medication given elevated liver function tests.

## 2016-05-25 NOTE — Assessment & Plan Note (Signed)
Worked up at the [place].  Felt to be related to lithium and hctz.  Off hctz.  States last liver function tests ok.  Follow liver panel.  Obtain results of recent labs.

## 2016-05-25 NOTE — Assessment & Plan Note (Signed)
Blood pressure doing better with the addition of amlodipine.  Follow pressures.  Continue same medication regimen.

## 2016-05-25 NOTE — Assessment & Plan Note (Signed)
Will need to follow cbc.

## 2016-08-21 DIAGNOSIS — R0982 Postnasal drip: Secondary | ICD-10-CM | POA: Diagnosis not present

## 2016-08-21 DIAGNOSIS — J302 Other seasonal allergic rhinitis: Secondary | ICD-10-CM | POA: Diagnosis not present

## 2016-08-23 ENCOUNTER — Telehealth: Payer: Self-pay | Admitting: Internal Medicine

## 2016-08-23 NOTE — Telephone Encounter (Signed)
Pt declined to have the AWV. Thank you!

## 2016-09-27 ENCOUNTER — Ambulatory Visit (INDEPENDENT_AMBULATORY_CARE_PROVIDER_SITE_OTHER): Payer: Medicare Other | Admitting: Internal Medicine

## 2016-09-27 ENCOUNTER — Encounter: Payer: Self-pay | Admitting: Internal Medicine

## 2016-09-27 VITALS — BP 128/84 | HR 62 | Temp 98.3°F | Ht 67.0 in | Wt 205.8 lb

## 2016-09-27 DIAGNOSIS — R7989 Other specified abnormal findings of blood chemistry: Secondary | ICD-10-CM

## 2016-09-27 DIAGNOSIS — D649 Anemia, unspecified: Secondary | ICD-10-CM

## 2016-09-27 DIAGNOSIS — F32A Depression, unspecified: Secondary | ICD-10-CM

## 2016-09-27 DIAGNOSIS — Z9109 Other allergy status, other than to drugs and biological substances: Secondary | ICD-10-CM

## 2016-09-27 DIAGNOSIS — M6282 Rhabdomyolysis: Secondary | ICD-10-CM

## 2016-09-27 DIAGNOSIS — M3320 Polymyositis, organ involvement unspecified: Secondary | ICD-10-CM

## 2016-09-27 DIAGNOSIS — N289 Disorder of kidney and ureter, unspecified: Secondary | ICD-10-CM | POA: Diagnosis not present

## 2016-09-27 DIAGNOSIS — I10 Essential (primary) hypertension: Secondary | ICD-10-CM

## 2016-09-27 DIAGNOSIS — E7800 Pure hypercholesterolemia, unspecified: Secondary | ICD-10-CM

## 2016-09-27 DIAGNOSIS — F329 Major depressive disorder, single episode, unspecified: Secondary | ICD-10-CM

## 2016-09-27 DIAGNOSIS — R945 Abnormal results of liver function studies: Secondary | ICD-10-CM

## 2016-09-27 NOTE — Progress Notes (Signed)
Patient ID: Donald Small, male   DOB: 10/06/1960, 56 y.o.   MRN: [id number]   Subjective:    Patient ID: Donald Small, male    DOB: [birth year], 56 y.o.   MRN: [id number]  HPI  Patient here for a scheduled follow up.  States went to urgent care 6 weeks ago.  Evaluated for watery eyes and some nasal congestion and drainage.  Using flonase.  Nasal congestion is better.  Eyes still watering.  Has tried some eye drops.  No change.  Discussed allergies.  Also discussed other causes for persistent watery eyes.  Will have opthalmology evaluate.  Saw his nephrologist 09/06/16.  Felt things were stable.  Also saw rheumatology 09/06/16.  On MTX and folic acid.  Feels legs are better.  Able to walk further now without increased aching.  Feels stronger.  Trying to watch his diet.  No chest pain.  No chest congestion.  No sob.  Has f/u with his psychiatrist at the end of the month.  Also planning to f/u with hematology 10/01/16 for f/u elevated M-spike.  Had bone survey.  Ok.  Overall he feels better.     Past Medical History:  Diagnosis Date  . Arthritis   . Benign prostatic hypertrophy   . Depression    With psychotic features  . Hypertension    Past Surgical History:  Procedure Laterality Date  . CYST EXCISION Left 11/06/13   ganglion cyst removed left hand   Family History  Problem Relation Age of Onset  . Hypertension Mother   . Arthritis Father   . Hypertension Father   . Cancer Father     prostate cancer  . Diabetes Brother    Social History   Social History  . Marital status: Married    Spouse name: N/A  . Number of children: N/A  . Years of education: N/A   Social History Main Topics  . Smoking status: Former Smoker    Packs/day: 2.00    Years: 15.00    Types: Cigarettes    Quit date: 09/23/1988  . Smokeless tobacco: Never Used  . Alcohol use No  . Drug use: No  . Sexual activity: Not Asked   Other Topics Concern  . None   Social History Narrative  . None      Outpatient Encounter Prescriptions as of 09/27/2016  Medication Sig  . aMILoride (MIDAMOR) 5 MG tablet Take 10 mg by mouth daily.   Marland Kitchen amLODipine (NORVASC) 5 MG tablet Take 5 mg by mouth daily.  . Calcium Citrate-Vitamin D (CALCIUM + D PO) Take by mouth.  . carvedilol (COREG) 12.5 MG tablet Take 12.5 mg by mouth 2 (two) times daily with a meal.  . FOLIC ACID PO Take by mouth.  . furosemide (LASIX) 20 MG tablet Take 20 mg by mouth.  . lithium carbonate 300 MG capsule Take 600 mg by mouth at bedtime.   . methotrexate (RHEUMATREX) 2.5 MG tablet Take 7.5 mg by mouth once a week. Take 6 tablets every Saturday  . OLANZapine (ZYPREXA) 10 MG tablet Take 10 mg by mouth at bedtime.  . Omega-3 Fatty Acids (FISH OIL PO) Take 1 capsule by mouth 2 (two) times daily.   . tamsulosin (FLOMAX) 0.4 MG CAPS capsule Take 0.8 mg by mouth daily.    No facility-administered encounter medications on file as of 09/27/2016.     Review of Systems  Constitutional: Negative for appetite change and unexpected weight change.  HENT: Positive for  congestion and postnasal drip.   Eyes: Negative for pain.       Watery eyes.   Respiratory: Negative for cough, chest tightness and shortness of breath.   Cardiovascular: Negative for chest pain, palpitations and leg swelling.  Gastrointestinal: Negative for abdominal pain, diarrhea, nausea and vomiting.  Genitourinary: Negative for difficulty urinating and dysuria.  Musculoskeletal: Negative for back pain and joint swelling.       Legs better.  Decreased aching.   Skin: Negative for color change and rash.  Neurological: Negative for dizziness, light-headedness and headaches.  Psychiatric/Behavioral: Negative for agitation and dysphoric mood.       Objective:     Blood pressure rechecked by me:  128/84  Physical Exam  Constitutional: He appears well-developed and well-nourished. No distress.  HENT:  Nose: Nose normal.  Mouth/Throat: Oropharynx is clear and moist.   Eyes: Conjunctivae are normal. Right eye exhibits no discharge. Left eye exhibits no discharge.  Neck: Neck supple. No thyromegaly present.  Cardiovascular: Normal rate and regular rhythm.   Pulmonary/Chest: Effort normal and breath sounds normal. No respiratory distress.  Abdominal: Soft. Bowel sounds are normal. There is no tenderness.  Musculoskeletal: He exhibits no edema or tenderness.  Lymphadenopathy:    He has no cervical adenopathy.  Skin: No rash noted. No erythema.  Psychiatric: He has a normal mood and affect. His behavior is normal.    BP 128/84   Pulse 62   Temp 98.3 F (36.8 C) (Oral)   Ht 5\' 7"  (1.702 m)   Wt 205 lb 12.8 oz (93.4 kg)   SpO2 95%   BMI 32.23 kg/m  Wt Readings from Last 3 Encounters:  09/27/16 205 lb 12.8 oz (93.4 kg)  05/24/16 207 lb 4 oz (94 kg)  12/14/15 220 lb 2 oz (99.8 kg)     Lab Results  Component Value Date   WBC 9.0 03/09/2015   HGB 11.9 (L) 03/09/2015   HCT 36.0 (L) 03/09/2015   PLT 210.0 03/09/2015   GLUCOSE 88 04/19/2015   CHOL 226 (A) 03/07/2016   TRIG 232 (A) 03/07/2016   HDL 33 (A) 03/07/2016   LDLCALC 147 03/07/2016   ALT 55 (A) 08/16/2015   AST 17 08/16/2015   NA 135 04/21/2015   K 4.4 04/19/2015   CL 95 (L) 04/19/2015   CREATININE 1.60 (H) 04/19/2015   BUN 19 04/19/2015   CO2 27 04/19/2015   TSH 3.032 03/04/2015   HGBA1C 5.5 11/22/2010       Assessment & Plan:   Problem List Items Addressed This Visit    Abnormal liver function tests    Worked up at the [place].  Felt to be related to lithium and hctz.  Off hctz.  Recheck liver panel next week with labs scheduled.        Anemia    Last hgb 12.8.  Due f/u with hematology 10/01/16.       Depression    Followed by psychiatry.  Stable.        Essential hypertension, benign    Blood pressure on my check better.  Continue same medication regimen.  Follow pressures.  Follow metabolic panel.       Hypercholesterolemia    Low cholesterol diet and exercise.   Follow lipid panel.  Will avoid statins given elevated liver function tests.        Polymyositis [facility])    Seeing rheumatology.  Off prednisone.  On MTX.  Just evaluated 09/06/16.  Renal insufficiency    Followed by nephrology.  Renal function stable.  Just evaluated 09/06/16.        Rhabdomyolysis    Seeing rheumatology.   Doing better.         Other Visit Diagnoses    Environmental allergies    -  Primary   eyes watering and some nasal congestion.  flonase helping.  conitnue.  add saline nasl spray.  opthalmology evaluation.         Einar Pheasant, MD

## 2016-09-27 NOTE — Patient Instructions (Signed)
Saline nasal spray - flush nose at least 2-3x/day 

## 2016-09-27 NOTE — Progress Notes (Signed)
Pre visit review using our clinic review tool, if applicable. No additional management support is needed unless otherwise documented below in the visit note. 

## 2016-09-29 ENCOUNTER — Encounter: Payer: Self-pay | Admitting: Internal Medicine

## 2016-09-29 NOTE — Assessment & Plan Note (Signed)
Followed by psychiatry.  Stable.  

## 2016-09-29 NOTE — Assessment & Plan Note (Signed)
Followed by nephrology.  Renal function stable.  Just evaluated 09/06/16.

## 2016-09-29 NOTE — Assessment & Plan Note (Addendum)
Seeing rheumatology.   Doing better.

## 2016-09-29 NOTE — Assessment & Plan Note (Signed)
Worked up at the [place].  Felt to be related to lithium and hctz.  Off hctz.  Recheck liver panel next week with labs scheduled.

## 2016-09-29 NOTE — Assessment & Plan Note (Signed)
Blood pressure on my check better.  Continue same medication regimen.  Follow pressures.  Follow metabolic panel.

## 2016-09-29 NOTE — Assessment & Plan Note (Signed)
Last hgb 12.8.  Due f/u with hematology 10/01/16.

## 2016-09-29 NOTE — Assessment & Plan Note (Signed)
Seeing rheumatology.  Off prednisone.  On MTX.  Just evaluated 09/06/16.

## 2016-09-29 NOTE — Assessment & Plan Note (Signed)
Low cholesterol diet and exercise.  Follow lipid panel.  Will avoid statins given elevated liver function tests.

## 2017-01-29 ENCOUNTER — Ambulatory Visit: Payer: Medicare Other | Admitting: Internal Medicine

## 2017-02-21 ENCOUNTER — Ambulatory Visit (INDEPENDENT_AMBULATORY_CARE_PROVIDER_SITE_OTHER): Payer: Medicare Other

## 2017-02-21 VITALS — BP 118/80 | HR 55 | Temp 98.0°F | Resp 12 | Ht 67.0 in | Wt 200.8 lb

## 2017-02-21 DIAGNOSIS — Z0000 Encounter for general adult medical examination without abnormal findings: Secondary | ICD-10-CM

## 2017-02-21 NOTE — Progress Notes (Signed)
Subjective:   Donald Small is a 56 y.o. male who presents for an Initial Medicare Annual Wellness Visit.  Review of Systems  No ROS.  Medicare Wellness Visit.  Cardiac Risk Factors include: advanced age (>37men, >79 women);hypertension    Objective:    Today's Vitals   02/21/17 0906  BP: 118/80  Pulse: (!) 55  Resp: 12  Temp: 98 F (36.7 C)  TempSrc: Oral  SpO2: 97%  Weight: 200 lb 12.8 oz (91.1 kg)  Height: 5\' 7"  (1.702 m)   Body mass index is 31.45 kg/m.  Current Medications (verified) Outpatient Encounter Prescriptions as of 02/21/2017  Medication Sig  . aMILoride (MIDAMOR) 5 MG tablet Take 10 mg by mouth daily.   Marland Kitchen amLODipine (NORVASC) 5 MG tablet Take 5 mg by mouth daily.  . Calcium Citrate-Vitamin D (CALCIUM + D PO) Take by mouth.  . carvedilol (COREG) 12.5 MG tablet Take 12.5 mg by mouth 2 (two) times daily with a meal.  . FOLIC ACID PO Take by mouth.  . furosemide (LASIX) 20 MG tablet Take 20 mg by mouth.  . lithium carbonate 300 MG capsule Take 600 mg by mouth at bedtime.   . methotrexate (RHEUMATREX) 2.5 MG tablet Take 7.5 mg by mouth once a week. Take 6 tablets every Saturday  . OLANZapine (ZYPREXA) 10 MG tablet Take 10 mg by mouth at bedtime.  . Omega-3 Fatty Acids (FISH OIL PO) Take 1 capsule by mouth 2 (two) times daily.   . tamsulosin (FLOMAX) 0.4 MG CAPS capsule Take 0.8 mg by mouth daily.    No facility-administered encounter medications on file as of 02/21/2017.     Allergies (verified) Patient has no known allergies.   History: Past Medical History:  Diagnosis Date  . Arthritis   . Benign prostatic hypertrophy   . Depression    With psychotic features  . Hypertension    Past Surgical History:  Procedure Laterality Date  . CYST EXCISION Left 11/06/13   ganglion cyst removed left hand   Family History  Problem Relation Age of Onset  . Hypertension Mother   . Arthritis Father   . Hypertension Father   . Cancer Father    prostate cancer  . Stroke Father   . Diabetes Brother    Social History   Occupational History  . Not on file.   Social History Main Topics  . Smoking status: Former Smoker    Packs/day: 2.00    Years: 15.00    Types: Cigarettes    Quit date: 09/23/1988  . Smokeless tobacco: Never Used  . Alcohol use No  . Drug use: No  . Sexual activity: Yes   Tobacco Counseling Counseling given: Not Answered   Activities of Daily Living In your present state of health, do you have any difficulty performing the following activities: 02/21/2017  Hearing? N  Vision? N  Difficulty concentrating or making decisions? N  Walking or climbing stairs? Y  Dressing or bathing? N  Doing errands, shopping? N  Preparing Food and eating ? N  Using the Toilet? N  In the past six months, have you accidently leaked urine? N  Do you have problems with loss of bowel control? N  Managing your Medications? N  Managing your Finances? N  Housekeeping or managing your Housekeeping? N  Some recent data might be hidden    Immunizations and Health Maintenance  There is no immunization history on file for this patient. Health Maintenance Due  Topic  Date Due  . Hepatitis C Screening  [birth year]  . HIV Screening  07/03/1975  . TETANUS/TDAP  07/03/1979    Patient Care Team: Einar Pheasant, MD as PCP - General (Internal Medicine)  Indicate any recent Medical Services you may have received from other than [facility] providers in the past year (date may be approximate).    Assessment:   This is a routine wellness examination for [place]. The goal of the wellness visit is to assist the patient how to close the gaps in care and create a preventative care plan for the patient.   Taking calcium VIT D as appropriate/Osteoporosis risk reviewed.  Medications reviewed; taking without issues or barriers.  Safety issues reviewed; smoke detectors in the home. No firearms in the home. Wears seatbelts when driving or  riding with others. Patient does wear sunscreen or protective clothing when in direct sunlight. No violence in the home.  Depression- PHQ 2 &9 complete.  No signs/symptoms or verbal communication regarding little pleasure in doing things, feeling down, depressed or hopeless. No changes in sleeping, energy, eating, concentrating.  No thoughts of self harm or harm towards others.  Time spent on this topic is 8 minutes.   Patient is alert, normal appearance, oriented to person/place/and time. Correctly identified the president of the [place], recall of 3/3 words, and performing simple calculations.  Patient displays appropriate judgement and can read correct time from watch face.  No new identified risk were noted.  No failures at ADL's or IADL's.   BMI- discussed the importance of a healthy diet, water intake and exercise. Educational material provided.   24 hour diet recall: Breakfast: Sausage biscuit, jelly Dinner: Pasta, meat, green vegetable Snack: fruit, crackers Daily fluid intake: 0 cups of caffeine, 6-8 cups of water  HTN- followed by PCP.  Dental- every six months.  [facility].  Sleep patterns- Sleeps 7-8 hours at night.  Wakes feeling rested.  TDAP vaccine discussed, deferred per patient preference, follow up with [facility] for administration.  Educational material provided.  HIV/Hep C Screening dicussed, deferred per patient preference, follow up with [facility] for labs.  Educational material provided.  Encouraged to bring vaccine record and lab results for abstraction.  Patient Concerns: None at this time. Follow up with PCP as needed.  Hearing/Vision screen Hearing Screening Comments: Patient is able to hear conversational tones without difficulty.  No issues reported.   Vision Screening Comments: Followed by [facility] Jule Ser) Wears corrective lenses when reading Last OV 2017 Visual acuity not assessed per patient preference since  they have regular follow up with the ophthalmologist  Dietary issues and exercise activities discussed: Current Exercise Habits: Home exercise routine, Type of exercise: walking;strength training/weights, Time (Minutes): 30, Frequency (Times/Week): 4, Weekly Exercise (Minutes/Week): 120, Intensity: Moderate  Goals    . Healthy Lifestyle          Stay active Stay hydrated Low carb foods      Depression Screen PHQ 2/9 Scores 02/21/2017 01/31/2014  PHQ - 2 Score 0 0  PHQ- 9 Score 0 -    Fall Risk Fall Risk  02/21/2017  Falls in the past year? No    Cognitive Function: MMSE - Mini Mental State Exam 02/21/2017  Orientation to time 5  Orientation to Place 5  Registration 3  Attention/ Calculation 5  Recall 3  Language- name 2 objects 2  Language- repeat 1  Language- follow 3 step command 3  Language- read & follow direction  1  Write a sentence 1  Copy design 1  Total score 30        Screening Tests Health Maintenance  Topic Date Due  . Hepatitis C Screening  1961-02-17  . HIV Screening  07/03/1975  . TETANUS/TDAP  07/03/1979  . INFLUENZA VACCINE  05/24/2017 (Originally 04/23/2017)  . COLONOSCOPY  10/25/2024        Plan:   End of life planning; Advanced aging; Advanced directives discussed.  No HCPOA/Living Will.  Additional information provided to help them start the conversation with family.  Copy of HCPOA/Living Will requested upon completion. Time spent on this topic is 25 minutes.  I have personally reviewed and noted the following in the patient's chart:   . Medical and social history . Use of alcohol, tobacco or illicit drugs  . Current medications and supplements . Functional ability and status . Nutritional status . Physical activity . Advanced directives . List of other physicians . Hospitalizations, surgeries, and ER visits in previous 12 months . Vitals . Screenings to include cognitive, depression, and falls . Referrals and appointments  In  addition, I have reviewed and discussed with patient certain preventive protocols, quality metrics, and best practice recommendations. A written personalized care plan for preventive services as well as general preventive health recommendations were provided to patient.     Varney Biles, LPN   01/23/50    Reviewed above information.  Agree with plan.  Dr Nicki Reaper

## 2017-02-21 NOTE — Patient Instructions (Addendum)
  Donald Small , Thank you for taking time to come for your Medicare Wellness Visit. I appreciate your ongoing commitment to your health goals. Please review the following plan we discussed and let me know if I can assist you in the future.   Follow up with Dr. Nicki Reaper as needed.    Bring a copy of your [place] and/or Living Will to be scanned into chart once completed.  Bring vaccine record and lab results from the [facility].  Have a great day!  These are the goals we discussed: Goals    . Healthy Lifestyle          Stay active Stay hydrated Low carb foods       This is a list of the screening recommended for you and due dates:  Health Maintenance  Topic Date Due  .  Hepatitis C: One time screening is recommended by Center for Disease Control  (CDC) for  adults born from [birth year] through 1966.   1961/01/11  . HIV Screening  07/03/1975  . Tetanus Vaccine  07/03/1979  . Flu Shot  05/24/2017*  . Colon Cancer Screening  10/25/2024  *Topic was postponed. The date shown is not the original due date.

## 2017-05-13 ENCOUNTER — Ambulatory Visit: Payer: Medicare Other | Admitting: Internal Medicine

## 2017-05-29 ENCOUNTER — Encounter: Payer: Self-pay | Admitting: Internal Medicine

## 2017-05-29 ENCOUNTER — Ambulatory Visit (INDEPENDENT_AMBULATORY_CARE_PROVIDER_SITE_OTHER): Payer: Medicare Other | Admitting: Internal Medicine

## 2017-05-29 DIAGNOSIS — I10 Essential (primary) hypertension: Secondary | ICD-10-CM

## 2017-05-29 DIAGNOSIS — D649 Anemia, unspecified: Secondary | ICD-10-CM

## 2017-05-29 DIAGNOSIS — R9389 Abnormal findings on diagnostic imaging of other specified body structures: Secondary | ICD-10-CM

## 2017-05-29 DIAGNOSIS — N183 Chronic kidney disease, stage 3 unspecified: Secondary | ICD-10-CM

## 2017-05-29 DIAGNOSIS — R7989 Other specified abnormal findings of blood chemistry: Secondary | ICD-10-CM

## 2017-05-29 DIAGNOSIS — F32A Depression, unspecified: Secondary | ICD-10-CM

## 2017-05-29 DIAGNOSIS — M3320 Polymyositis, organ involvement unspecified: Secondary | ICD-10-CM

## 2017-05-29 DIAGNOSIS — E7800 Pure hypercholesterolemia, unspecified: Secondary | ICD-10-CM

## 2017-05-29 DIAGNOSIS — R945 Abnormal results of liver function studies: Secondary | ICD-10-CM

## 2017-05-29 DIAGNOSIS — R938 Abnormal findings on diagnostic imaging of other specified body structures: Secondary | ICD-10-CM | POA: Diagnosis not present

## 2017-05-29 DIAGNOSIS — F329 Major depressive disorder, single episode, unspecified: Secondary | ICD-10-CM

## 2017-05-29 NOTE — Progress Notes (Signed)
Patient ID: Donald Small, male   DOB: 1961-06-19, 56 y.o.   MRN: [id number]   Subjective:    Patient ID: Donald Small, male    DOB: 10-28-1960, 56 y.o.   MRN: [id number]  HPI  Patient here for a scheduled follow up.  He reports he is doing relatively well.  Staying busy.  No chest pain.  No sob.  No acid reflux.  No abdominal pain.  Bowels moving.  Still has some leg aches, but overall improved.  Continues to f/u with rheumatology.  Has f/u with a new rheumatologist in [place] soon.  He is still seeing nephrology.  States creatinine increased some.  Has been off lasix.  Plans for f/u 06/13/17.  Blood pressures averaging 124/80-82.  Discussed f/u.  Discuss obtaining outside lab results.  Medical release form completed.  He will also have them send updated labs.  Discussed previous abnormal chest ct.  Had reported had f/u at the [place].  Will have them send me records.     Past Medical History:  Diagnosis Date  . Arthritis   . Benign prostatic hypertrophy   . Depression    With psychotic features  . Hypertension    Past Surgical History:  Procedure Laterality Date  . CYST EXCISION Left 11/06/13   ganglion cyst removed left hand   Family History  Problem Relation Age of Onset  . Hypertension Mother   . Arthritis Father   . Hypertension Father   . Cancer Father        prostate cancer  . Stroke Father   . Diabetes Brother    Social History   Social History  . Marital status: Married    Spouse name: N/A  . Number of children: N/A  . Years of education: N/A   Social History Main Topics  . Smoking status: Former Smoker    Packs/day: 2.00    Years: 15.00    Types: Cigarettes    Quit date: 09/23/1988  . Smokeless tobacco: Never Used  . Alcohol use No  . Drug use: No  . Sexual activity: Yes   Other Topics Concern  . None   Social History Narrative  . None    Outpatient Encounter Prescriptions as of 05/29/2017  Medication Sig  . aMILoride (MIDAMOR) 5 MG  tablet Take 10 mg by mouth daily.   Marland Kitchen amLODipine (NORVASC) 5 MG tablet Take 5 mg by mouth daily.  . Calcium Citrate-Vitamin D (CALCIUM + D PO) Take by mouth.  . carvedilol (COREG) 12.5 MG tablet Take 12.5 mg by mouth 2 (two) times daily with a meal.  . FOLIC ACID PO Take by mouth.  . lithium carbonate 300 MG capsule Take 600 mg by mouth at bedtime.   . methotrexate (RHEUMATREX) 2.5 MG tablet Take 7.5 mg by mouth once a week. Take 6 tablets every Saturday  . OLANZapine (ZYPREXA) 10 MG tablet Take 10 mg by mouth at bedtime.  . Omega-3 Fatty Acids (FISH OIL PO) Take 1 capsule by mouth 2 (two) times daily.   . tamsulosin (FLOMAX) 0.4 MG CAPS capsule Take 0.8 mg by mouth daily.   . [DISCONTINUED] furosemide (LASIX) 20 MG tablet Take 20 mg by mouth.   No facility-administered encounter medications on file as of 05/29/2017.     Review of Systems  Constitutional: Negative for appetite change and unexpected weight change.  HENT: Negative for congestion and sinus pressure.   Respiratory: Negative for cough, chest tightness and shortness of breath.  Cardiovascular: Negative for chest pain, palpitations and leg swelling.  Gastrointestinal: Negative for abdominal pain, diarrhea, nausea and vomiting.  Genitourinary: Negative for difficulty urinating and dysuria.  Musculoskeletal: Negative for joint swelling.       Muscle aches improved.    Skin: Negative for color change and rash.  Neurological: Negative for dizziness, light-headedness and headaches.  Psychiatric/Behavioral: Negative for agitation and dysphoric mood.       Objective:     Blood pressure rechecked by me:  128/84  Physical Exam  Constitutional: He appears well-developed and well-nourished. No distress.  HENT:  Nose: Nose normal.  Mouth/Throat: Oropharynx is clear and moist.  Neck: Neck supple. No thyromegaly present.  Cardiovascular: Normal rate and regular rhythm.   Pulmonary/Chest: Effort normal and breath sounds normal. No  respiratory distress.  Abdominal: Soft. Bowel sounds are normal. There is no tenderness.  Musculoskeletal: He exhibits no edema or tenderness.  Lymphadenopathy:    He has no cervical adenopathy.  Skin: No rash noted. No erythema.  Psychiatric: He has a normal mood and affect. His behavior is normal.    BP 130/80 (BP Location: Left Arm, Patient Position: Sitting, Cuff Size: Normal)   Pulse 62   Temp 98.7 F (37.1 C) (Oral)   Resp 16   Ht 5' 6.93" (1.7 m)   Wt 201 lb 12.8 oz (91.5 kg)   SpO2 96%   BMI 31.67 kg/m  Wt Readings from Last 3 Encounters:  05/29/17 201 lb 12.8 oz (91.5 kg)  02/21/17 200 lb 12.8 oz (91.1 kg)  09/27/16 205 lb 12.8 oz (93.4 kg)     Lab Results  Component Value Date   WBC 9.0 03/09/2015   HGB 11.9 (L) 03/09/2015   HCT 36.0 (L) 03/09/2015   PLT 210.0 03/09/2015   GLUCOSE 88 04/19/2015   CHOL 226 (A) 03/07/2016   TRIG 232 (A) 03/07/2016   HDL 33 (A) 03/07/2016   LDLCALC 147 03/07/2016   ALT 55 (A) 08/16/2015   AST 17 08/16/2015   NA 135 04/21/2015   K 4.4 04/19/2015   CL 95 (L) 04/19/2015   CREATININE 1.60 (H) 04/19/2015   BUN 19 04/19/2015   CO2 27 04/19/2015   TSH 3.032 03/04/2015   HGBA1C 5.5 11/22/2010       Assessment & Plan:   Problem List Items Addressed This Visit    Abnormal CT of the chest    Discussed again with him today.  States has had f/u at the [place].  Need to obtain records.  Per report, no further w/up warranted.        Abnormal liver function tests    Worked up at the [place].  Felt to be related to lithium and hctz.  Off hctz.  Follow liver function tests.  Obtain outside labs.        Anemia    Has seen hematology.  Obtain labs.        CKD (chronic kidney disease) stage 3, GFR 30-59 ml/min    Followed by nephrology.  Reports creatinine increased some on last check.  Has f/u planned 06/13/17.  Is off lasix now.  Blood pressure doing well.        Depression    Followed by psychiatry.  Doing well.        Essential  hypertension, benign    Blood pressure under good control.  Continue same medication regimen.  Follow pressures.  Follow metabolic panel.  Blood pressure on my check today 128/84.  Hypercholesterolemia    Low cholesterol diet and exercise.  Follow lipid panel.        Polymyositis ([place])    Followed by rheumatology.  Off prednisone.  On MTX.  Has f/u soon.           Einar Pheasant, MD

## 2017-05-29 NOTE — Patient Instructions (Addendum)
Please forward most recent labs and any CT scans or follow up cxr.  (previous CT scan 2015 recommended f/u).  Needs any scans or xrays since 2015.

## 2017-05-31 ENCOUNTER — Encounter: Payer: Self-pay | Admitting: Internal Medicine

## 2017-05-31 NOTE — Assessment & Plan Note (Signed)
Followed by psychiatry.  Doing well.

## 2017-05-31 NOTE — Assessment & Plan Note (Signed)
Blood pressure under good control.  Continue same medication regimen.  Follow pressures.  Follow metabolic panel.  Blood pressure on my check today 128/84.

## 2017-05-31 NOTE — Assessment & Plan Note (Signed)
Followed by nephrology.  Reports creatinine increased some on last check.  Has f/u planned 06/13/17.  Is off lasix now.  Blood pressure doing well.

## 2017-05-31 NOTE — Assessment & Plan Note (Signed)
Has seen hematology.  Obtain labs.

## 2017-05-31 NOTE — Assessment & Plan Note (Signed)
Low cholesterol diet and exercise.  Follow lipid panel.   

## 2017-05-31 NOTE — Assessment & Plan Note (Signed)
Followed by rheumatology.  Off prednisone.  On MTX.  Has f/u soon.

## 2017-05-31 NOTE — Assessment & Plan Note (Signed)
Worked up at the [place].  Felt to be related to lithium and hctz.  Off hctz.  Follow liver function tests.  Obtain outside labs.

## 2017-05-31 NOTE — Assessment & Plan Note (Signed)
Discussed again with him today.  States has had f/u at the [place].  Need to obtain records.  Per report, no further w/up warranted.

## 2017-11-27 ENCOUNTER — Ambulatory Visit: Payer: Medicare Other | Admitting: Internal Medicine

## 2018-02-24 ENCOUNTER — Ambulatory Visit: Payer: Medicare Other

## 2018-09-28 DIAGNOSIS — J189 Pneumonia, unspecified organism: Secondary | ICD-10-CM | POA: Diagnosis not present

## 2018-09-28 DIAGNOSIS — R05 Cough: Secondary | ICD-10-CM | POA: Diagnosis not present

## 2019-01-22 ENCOUNTER — Telehealth: Payer: Self-pay

## 2019-01-22 NOTE — Telephone Encounter (Signed)
The patient states he will call the office when he is financially able.

## 2019-01-22 NOTE — Telephone Encounter (Signed)
LMTCB. Need to schedule pt for a physical with Dr. Nicki Reaper. [facility] may speak with pt.

## 2019-04-11 DIAGNOSIS — R05 Cough: Secondary | ICD-10-CM | POA: Diagnosis not present

## 2019-04-11 DIAGNOSIS — J189 Pneumonia, unspecified organism: Secondary | ICD-10-CM | POA: Diagnosis not present

## 2019-04-16 DIAGNOSIS — R945 Abnormal results of liver function studies: Secondary | ICD-10-CM | POA: Diagnosis not present

## 2019-04-16 DIAGNOSIS — J1289 Other viral pneumonia: Secondary | ICD-10-CM | POA: Diagnosis present

## 2019-04-16 DIAGNOSIS — E440 Moderate protein-calorie malnutrition: Secondary | ICD-10-CM | POA: Diagnosis not present

## 2019-04-16 DIAGNOSIS — N170 Acute kidney failure with tubular necrosis: Secondary | ICD-10-CM | POA: Diagnosis present

## 2019-04-16 DIAGNOSIS — R509 Fever, unspecified: Secondary | ICD-10-CM | POA: Diagnosis not present

## 2019-04-16 DIAGNOSIS — I129 Hypertensive chronic kidney disease with stage 1 through stage 4 chronic kidney disease, or unspecified chronic kidney disease: Secondary | ICD-10-CM | POA: Diagnosis present

## 2019-04-16 DIAGNOSIS — K7200 Acute and subacute hepatic failure without coma: Secondary | ICD-10-CM | POA: Diagnosis not present

## 2019-04-16 DIAGNOSIS — T56891A Toxic effect of other metals, accidental (unintentional), initial encounter: Secondary | ICD-10-CM | POA: Diagnosis not present

## 2019-04-16 DIAGNOSIS — N189 Chronic kidney disease, unspecified: Secondary | ICD-10-CM | POA: Diagnosis present

## 2019-04-16 DIAGNOSIS — E872 Acidosis: Secondary | ICD-10-CM | POA: Diagnosis not present

## 2019-04-16 DIAGNOSIS — F209 Schizophrenia, unspecified: Secondary | ICD-10-CM | POA: Diagnosis present

## 2019-04-16 DIAGNOSIS — N179 Acute kidney failure, unspecified: Secondary | ICD-10-CM | POA: Diagnosis not present

## 2019-04-16 DIAGNOSIS — R944 Abnormal results of kidney function studies: Secondary | ICD-10-CM | POA: Diagnosis not present

## 2019-04-16 DIAGNOSIS — I10 Essential (primary) hypertension: Secondary | ICD-10-CM | POA: Diagnosis not present

## 2019-04-16 DIAGNOSIS — E875 Hyperkalemia: Secondary | ICD-10-CM | POA: Diagnosis not present

## 2019-04-16 DIAGNOSIS — U071 COVID-19: Secondary | ICD-10-CM | POA: Diagnosis not present

## 2019-04-16 DIAGNOSIS — J988 Other specified respiratory disorders: Secondary | ICD-10-CM | POA: Diagnosis not present

## 2019-04-16 DIAGNOSIS — R918 Other nonspecific abnormal finding of lung field: Secondary | ICD-10-CM | POA: Diagnosis not present

## 2019-04-16 DIAGNOSIS — J9601 Acute respiratory failure with hypoxia: Secondary | ICD-10-CM | POA: Diagnosis not present

## 2019-04-16 DIAGNOSIS — S36119A Unspecified injury of liver, initial encounter: Secondary | ICD-10-CM | POA: Diagnosis not present

## 2019-04-16 DIAGNOSIS — G9340 Encephalopathy, unspecified: Secondary | ICD-10-CM | POA: Insufficient documentation

## 2019-04-16 DIAGNOSIS — R4182 Altered mental status, unspecified: Secondary | ICD-10-CM | POA: Diagnosis not present

## 2019-04-16 DIAGNOSIS — Z6829 Body mass index (BMI) 29.0-29.9, adult: Secondary | ICD-10-CM | POA: Diagnosis not present

## 2019-04-16 DIAGNOSIS — R05 Cough: Secondary | ICD-10-CM | POA: Diagnosis not present

## 2019-04-16 DIAGNOSIS — R197 Diarrhea, unspecified: Secondary | ICD-10-CM | POA: Diagnosis not present

## 2019-04-16 DIAGNOSIS — J984 Other disorders of lung: Secondary | ICD-10-CM | POA: Diagnosis not present

## 2019-04-16 DIAGNOSIS — F319 Bipolar disorder, unspecified: Secondary | ICD-10-CM | POA: Diagnosis not present

## 2019-04-16 DIAGNOSIS — Z79899 Other long term (current) drug therapy: Secondary | ICD-10-CM | POA: Diagnosis not present

## 2019-04-16 DIAGNOSIS — K649 Unspecified hemorrhoids: Secondary | ICD-10-CM | POA: Diagnosis present

## 2019-04-24 DIAGNOSIS — N183 Chronic kidney disease, stage 3 (moderate): Secondary | ICD-10-CM | POA: Diagnosis not present

## 2019-04-24 DIAGNOSIS — F209 Schizophrenia, unspecified: Secondary | ICD-10-CM | POA: Diagnosis not present

## 2019-04-24 DIAGNOSIS — D649 Anemia, unspecified: Secondary | ICD-10-CM | POA: Diagnosis not present

## 2019-04-24 DIAGNOSIS — U071 COVID-19: Secondary | ICD-10-CM | POA: Diagnosis not present

## 2019-04-24 DIAGNOSIS — I129 Hypertensive chronic kidney disease with stage 1 through stage 4 chronic kidney disease, or unspecified chronic kidney disease: Secondary | ICD-10-CM | POA: Diagnosis not present

## 2019-04-24 DIAGNOSIS — N400 Enlarged prostate without lower urinary tract symptoms: Secondary | ICD-10-CM | POA: Diagnosis not present

## 2019-04-24 DIAGNOSIS — J1289 Other viral pneumonia: Secondary | ICD-10-CM | POA: Diagnosis not present

## 2019-04-24 DIAGNOSIS — E7800 Pure hypercholesterolemia, unspecified: Secondary | ICD-10-CM | POA: Diagnosis not present

## 2019-04-24 DIAGNOSIS — F329 Major depressive disorder, single episode, unspecified: Secondary | ICD-10-CM | POA: Diagnosis not present

## 2019-04-26 DIAGNOSIS — N183 Chronic kidney disease, stage 3 (moderate): Secondary | ICD-10-CM | POA: Diagnosis not present

## 2019-04-26 DIAGNOSIS — I129 Hypertensive chronic kidney disease with stage 1 through stage 4 chronic kidney disease, or unspecified chronic kidney disease: Secondary | ICD-10-CM | POA: Diagnosis not present

## 2019-04-26 DIAGNOSIS — N400 Enlarged prostate without lower urinary tract symptoms: Secondary | ICD-10-CM | POA: Diagnosis not present

## 2019-04-26 DIAGNOSIS — D649 Anemia, unspecified: Secondary | ICD-10-CM | POA: Diagnosis not present

## 2019-04-26 DIAGNOSIS — U071 COVID-19: Secondary | ICD-10-CM | POA: Diagnosis not present

## 2019-04-26 DIAGNOSIS — J1289 Other viral pneumonia: Secondary | ICD-10-CM | POA: Diagnosis not present

## 2019-04-30 DIAGNOSIS — I129 Hypertensive chronic kidney disease with stage 1 through stage 4 chronic kidney disease, or unspecified chronic kidney disease: Secondary | ICD-10-CM | POA: Diagnosis not present

## 2019-04-30 DIAGNOSIS — D649 Anemia, unspecified: Secondary | ICD-10-CM | POA: Diagnosis not present

## 2019-04-30 DIAGNOSIS — N183 Chronic kidney disease, stage 3 (moderate): Secondary | ICD-10-CM | POA: Diagnosis not present

## 2019-04-30 DIAGNOSIS — N400 Enlarged prostate without lower urinary tract symptoms: Secondary | ICD-10-CM | POA: Diagnosis not present

## 2019-04-30 DIAGNOSIS — J1289 Other viral pneumonia: Secondary | ICD-10-CM | POA: Diagnosis not present

## 2019-04-30 DIAGNOSIS — U071 COVID-19: Secondary | ICD-10-CM | POA: Diagnosis not present

## 2019-05-06 DIAGNOSIS — N183 Chronic kidney disease, stage 3 (moderate): Secondary | ICD-10-CM | POA: Diagnosis not present

## 2019-05-06 DIAGNOSIS — I129 Hypertensive chronic kidney disease with stage 1 through stage 4 chronic kidney disease, or unspecified chronic kidney disease: Secondary | ICD-10-CM | POA: Diagnosis not present

## 2019-05-06 DIAGNOSIS — J1289 Other viral pneumonia: Secondary | ICD-10-CM | POA: Diagnosis not present

## 2019-05-06 DIAGNOSIS — D649 Anemia, unspecified: Secondary | ICD-10-CM | POA: Diagnosis not present

## 2019-05-06 DIAGNOSIS — N400 Enlarged prostate without lower urinary tract symptoms: Secondary | ICD-10-CM | POA: Diagnosis not present

## 2019-05-06 DIAGNOSIS — U071 COVID-19: Secondary | ICD-10-CM | POA: Diagnosis not present

## 2019-05-07 DIAGNOSIS — N183 Chronic kidney disease, stage 3 (moderate): Secondary | ICD-10-CM | POA: Diagnosis not present

## 2019-05-07 DIAGNOSIS — D649 Anemia, unspecified: Secondary | ICD-10-CM | POA: Diagnosis not present

## 2019-05-07 DIAGNOSIS — I129 Hypertensive chronic kidney disease with stage 1 through stage 4 chronic kidney disease, or unspecified chronic kidney disease: Secondary | ICD-10-CM | POA: Diagnosis not present

## 2019-05-07 DIAGNOSIS — U071 COVID-19: Secondary | ICD-10-CM | POA: Diagnosis not present

## 2019-05-07 DIAGNOSIS — N400 Enlarged prostate without lower urinary tract symptoms: Secondary | ICD-10-CM | POA: Diagnosis not present

## 2019-05-07 DIAGNOSIS — J1289 Other viral pneumonia: Secondary | ICD-10-CM | POA: Diagnosis not present

## 2019-05-11 DIAGNOSIS — D649 Anemia, unspecified: Secondary | ICD-10-CM | POA: Diagnosis not present

## 2019-05-11 DIAGNOSIS — U071 COVID-19: Secondary | ICD-10-CM | POA: Diagnosis not present

## 2019-05-11 DIAGNOSIS — N400 Enlarged prostate without lower urinary tract symptoms: Secondary | ICD-10-CM | POA: Diagnosis not present

## 2019-05-11 DIAGNOSIS — I129 Hypertensive chronic kidney disease with stage 1 through stage 4 chronic kidney disease, or unspecified chronic kidney disease: Secondary | ICD-10-CM | POA: Diagnosis not present

## 2019-05-11 DIAGNOSIS — J1289 Other viral pneumonia: Secondary | ICD-10-CM | POA: Diagnosis not present

## 2019-05-11 DIAGNOSIS — N183 Chronic kidney disease, stage 3 (moderate): Secondary | ICD-10-CM | POA: Diagnosis not present

## 2019-05-13 DIAGNOSIS — N400 Enlarged prostate without lower urinary tract symptoms: Secondary | ICD-10-CM | POA: Diagnosis not present

## 2019-05-13 DIAGNOSIS — J1289 Other viral pneumonia: Secondary | ICD-10-CM | POA: Diagnosis not present

## 2019-05-13 DIAGNOSIS — I129 Hypertensive chronic kidney disease with stage 1 through stage 4 chronic kidney disease, or unspecified chronic kidney disease: Secondary | ICD-10-CM | POA: Diagnosis not present

## 2019-05-13 DIAGNOSIS — D649 Anemia, unspecified: Secondary | ICD-10-CM | POA: Diagnosis not present

## 2019-05-13 DIAGNOSIS — U071 COVID-19: Secondary | ICD-10-CM | POA: Diagnosis not present

## 2019-05-13 DIAGNOSIS — N183 Chronic kidney disease, stage 3 (moderate): Secondary | ICD-10-CM | POA: Diagnosis not present

## 2019-05-18 DIAGNOSIS — J1289 Other viral pneumonia: Secondary | ICD-10-CM | POA: Diagnosis not present

## 2019-05-18 DIAGNOSIS — N183 Chronic kidney disease, stage 3 (moderate): Secondary | ICD-10-CM | POA: Diagnosis not present

## 2019-05-18 DIAGNOSIS — N400 Enlarged prostate without lower urinary tract symptoms: Secondary | ICD-10-CM | POA: Diagnosis not present

## 2019-05-18 DIAGNOSIS — I129 Hypertensive chronic kidney disease with stage 1 through stage 4 chronic kidney disease, or unspecified chronic kidney disease: Secondary | ICD-10-CM | POA: Diagnosis not present

## 2019-05-18 DIAGNOSIS — D649 Anemia, unspecified: Secondary | ICD-10-CM | POA: Diagnosis not present

## 2019-05-18 DIAGNOSIS — U071 COVID-19: Secondary | ICD-10-CM | POA: Diagnosis not present

## 2019-05-21 DIAGNOSIS — U071 COVID-19: Secondary | ICD-10-CM | POA: Diagnosis not present

## 2019-05-21 DIAGNOSIS — I129 Hypertensive chronic kidney disease with stage 1 through stage 4 chronic kidney disease, or unspecified chronic kidney disease: Secondary | ICD-10-CM | POA: Diagnosis not present

## 2019-05-21 DIAGNOSIS — N183 Chronic kidney disease, stage 3 (moderate): Secondary | ICD-10-CM | POA: Diagnosis not present

## 2019-05-21 DIAGNOSIS — J1289 Other viral pneumonia: Secondary | ICD-10-CM | POA: Diagnosis not present

## 2019-05-21 DIAGNOSIS — D649 Anemia, unspecified: Secondary | ICD-10-CM | POA: Diagnosis not present

## 2019-05-21 DIAGNOSIS — N400 Enlarged prostate without lower urinary tract symptoms: Secondary | ICD-10-CM | POA: Diagnosis not present

## 2019-05-24 DIAGNOSIS — J1289 Other viral pneumonia: Secondary | ICD-10-CM | POA: Diagnosis not present

## 2019-05-24 DIAGNOSIS — E7800 Pure hypercholesterolemia, unspecified: Secondary | ICD-10-CM | POA: Diagnosis not present

## 2019-05-24 DIAGNOSIS — U071 COVID-19: Secondary | ICD-10-CM | POA: Diagnosis not present

## 2019-05-24 DIAGNOSIS — F209 Schizophrenia, unspecified: Secondary | ICD-10-CM | POA: Diagnosis not present

## 2019-05-24 DIAGNOSIS — N183 Chronic kidney disease, stage 3 (moderate): Secondary | ICD-10-CM | POA: Diagnosis not present

## 2019-05-24 DIAGNOSIS — D649 Anemia, unspecified: Secondary | ICD-10-CM | POA: Diagnosis not present

## 2019-05-24 DIAGNOSIS — N400 Enlarged prostate without lower urinary tract symptoms: Secondary | ICD-10-CM | POA: Diagnosis not present

## 2019-05-24 DIAGNOSIS — F329 Major depressive disorder, single episode, unspecified: Secondary | ICD-10-CM | POA: Diagnosis not present

## 2019-05-24 DIAGNOSIS — I129 Hypertensive chronic kidney disease with stage 1 through stage 4 chronic kidney disease, or unspecified chronic kidney disease: Secondary | ICD-10-CM | POA: Diagnosis not present

## 2019-05-25 DIAGNOSIS — D649 Anemia, unspecified: Secondary | ICD-10-CM | POA: Diagnosis not present

## 2019-05-25 DIAGNOSIS — N400 Enlarged prostate without lower urinary tract symptoms: Secondary | ICD-10-CM | POA: Diagnosis not present

## 2019-05-25 DIAGNOSIS — N183 Chronic kidney disease, stage 3 (moderate): Secondary | ICD-10-CM | POA: Diagnosis not present

## 2019-05-25 DIAGNOSIS — U071 COVID-19: Secondary | ICD-10-CM | POA: Diagnosis not present

## 2019-05-25 DIAGNOSIS — J1289 Other viral pneumonia: Secondary | ICD-10-CM | POA: Diagnosis not present

## 2019-05-25 DIAGNOSIS — I129 Hypertensive chronic kidney disease with stage 1 through stage 4 chronic kidney disease, or unspecified chronic kidney disease: Secondary | ICD-10-CM | POA: Diagnosis not present

## 2019-06-01 DIAGNOSIS — N183 Chronic kidney disease, stage 3 (moderate): Secondary | ICD-10-CM | POA: Diagnosis not present

## 2019-06-01 DIAGNOSIS — J1289 Other viral pneumonia: Secondary | ICD-10-CM | POA: Diagnosis not present

## 2019-06-01 DIAGNOSIS — N400 Enlarged prostate without lower urinary tract symptoms: Secondary | ICD-10-CM | POA: Diagnosis not present

## 2019-06-01 DIAGNOSIS — U071 COVID-19: Secondary | ICD-10-CM | POA: Diagnosis not present

## 2019-06-01 DIAGNOSIS — D649 Anemia, unspecified: Secondary | ICD-10-CM | POA: Diagnosis not present

## 2019-06-01 DIAGNOSIS — I129 Hypertensive chronic kidney disease with stage 1 through stage 4 chronic kidney disease, or unspecified chronic kidney disease: Secondary | ICD-10-CM | POA: Diagnosis not present

## 2019-06-02 DIAGNOSIS — U071 COVID-19: Secondary | ICD-10-CM | POA: Diagnosis not present

## 2019-06-02 DIAGNOSIS — N400 Enlarged prostate without lower urinary tract symptoms: Secondary | ICD-10-CM | POA: Diagnosis not present

## 2019-06-02 DIAGNOSIS — J1289 Other viral pneumonia: Secondary | ICD-10-CM | POA: Diagnosis not present

## 2019-06-02 DIAGNOSIS — I129 Hypertensive chronic kidney disease with stage 1 through stage 4 chronic kidney disease, or unspecified chronic kidney disease: Secondary | ICD-10-CM | POA: Diagnosis not present

## 2019-06-02 DIAGNOSIS — D649 Anemia, unspecified: Secondary | ICD-10-CM | POA: Diagnosis not present

## 2019-06-02 DIAGNOSIS — N183 Chronic kidney disease, stage 3 (moderate): Secondary | ICD-10-CM | POA: Diagnosis not present

## 2020-12-05 LAB — BASIC METABOLIC PANEL
BUN: 60 — AB (ref 4–21)
CO2: 25 — AB (ref 13–22)
Chloride: 103 (ref 99–108)
Creatinine: 5.8 — AB (ref 0.6–1.3)
Glucose: 85
Potassium: 4 (ref 3.4–5.3)
Sodium: 133 — AB (ref 137–147)

## 2020-12-05 LAB — CBC AND DIFFERENTIAL
HCT: 29 — AB (ref 41–53)
Hemoglobin: 10.4 — AB (ref 13.5–17.5)
Platelets: 118 — AB (ref 150–399)
WBC: 4.7

## 2020-12-05 LAB — CBC: RBC: 3.2 — AB (ref 3.87–5.11)

## 2020-12-05 LAB — COMPREHENSIVE METABOLIC PANEL: Calcium: 8.7 (ref 8.7–10.7)

## 2020-12-12 ENCOUNTER — Other Ambulatory Visit: Payer: Self-pay

## 2020-12-12 ENCOUNTER — Encounter: Payer: Self-pay | Admitting: Internal Medicine

## 2020-12-12 ENCOUNTER — Ambulatory Visit (INDEPENDENT_AMBULATORY_CARE_PROVIDER_SITE_OTHER): Payer: Medicare Other | Admitting: Internal Medicine

## 2020-12-12 VITALS — BP 120/80 | HR 50 | Temp 97.5°F | Ht 67.0 in | Wt 212.2 lb

## 2020-12-12 DIAGNOSIS — F320 Major depressive disorder, single episode, mild: Secondary | ICD-10-CM

## 2020-12-12 DIAGNOSIS — R7989 Other specified abnormal findings of blood chemistry: Secondary | ICD-10-CM

## 2020-12-12 DIAGNOSIS — D649 Anemia, unspecified: Secondary | ICD-10-CM | POA: Diagnosis not present

## 2020-12-12 DIAGNOSIS — R945 Abnormal results of liver function studies: Secondary | ICD-10-CM | POA: Diagnosis not present

## 2020-12-12 DIAGNOSIS — M3320 Polymyositis, organ involvement unspecified: Secondary | ICD-10-CM | POA: Diagnosis not present

## 2020-12-12 DIAGNOSIS — E7800 Pure hypercholesterolemia, unspecified: Secondary | ICD-10-CM | POA: Diagnosis not present

## 2020-12-12 DIAGNOSIS — F32A Depression, unspecified: Secondary | ICD-10-CM

## 2020-12-12 DIAGNOSIS — N184 Chronic kidney disease, stage 4 (severe): Secondary | ICD-10-CM | POA: Diagnosis not present

## 2020-12-12 DIAGNOSIS — I10 Essential (primary) hypertension: Secondary | ICD-10-CM

## 2020-12-12 NOTE — Progress Notes (Signed)
Patient ID: Donald Small, male   DOB: 17-Aug-1961, 60 y.o.   MRN: [id number]   Subjective:    Patient ID: Donald Small, male    DOB: 1961-06-25, 60 y.o.   MRN: [id number]  HPI This visit occurred during the SARS-CoV-2 public health emergency.  Safety protocols were in place, including screening questions prior to the visit, additional usage of staff PPE, and extensive cleaning of exam room while observing appropriate contact time as indicated for disinfecting solutions.  Patient here to reestablish care.  Has been followed at the [place] in [place]. 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  Also followed by nephrology for CKD - previous baseline creatinine 2-2.6.  New baseline creatinine 5 since 04/2019.  Also has history of polymyositis.  No chest pain or sob.  No abdominal pain.  No leg swelling.  Energy level ok.  Trying to watch his diet and exercise.  [place].  Legs - stable. Off MTX.  Seeing psychiatry.   Off lithium.  Stable.  Overall he feels he is doing relatively well.   Past Medical History:  Diagnosis Date  . Arthritis   . Benign prostatic hypertrophy   . Depression    With psychotic features  . Hypertension    Past Surgical History:  Procedure Laterality Date  . CYST EXCISION Left 11/06/13   ganglion cyst removed left hand   Family History  Problem Relation Age of Onset  . Hypertension Mother   . Arthritis Father   . Hypertension Father   . Cancer Father        prostate cancer  . Stroke Father   . Diabetes Brother    Social History   Socioeconomic History  . Marital status: Married    Spouse name: Not on file  . Number of children: Not on file  . Years of education: Not on file  . Highest education level: Not on file  Occupational History  . Not on file  Tobacco Use  . Smoking status: Former Smoker    Packs/day: 2.00    Years: 15.00    Pack years: 30.00    Types: Cigarettes    Quit date: 09/23/1988     Years since quitting: 32.2  . Smokeless tobacco: Never Used  Substance and Sexual Activity  . Alcohol use: No    Alcohol/week: 0.0 standard drinks  . Drug use: No  . Sexual activity: Yes  Other Topics Concern  . Not on file  Social History Narrative  . Not on file   Social Determinants of Health   Financial Resource Strain: Not on file  Food Insecurity: Not on file  Transportation Needs: Not on file  Physical Activity: Not on file  Stress: Not on file  Social Connections: Not on file    Outpatient Encounter Medications as of 12/12/2020  Medication Sig  . amLODipine (NORVASC) 5 MG tablet Take 10 mg by mouth in the morning and at bedtime.  . Calcium Citrate-Vitamin D (CALCIUM + D PO) Take by mouth.  . carvedilol (COREG) 12.5 MG tablet Take 12.5 mg by mouth 2 (two) times daily with a meal.  . FOLIC ACID PO Take by mouth.  . Omega-3 Fatty Acids (FISH OIL PO) Take 1 capsule by mouth 2 (two) times daily.   . tamsulosin (FLOMAX) 0.4 MG CAPS capsule Take 0.8 mg by mouth daily.   Marland Kitchen aMILoride (MIDAMOR) 5 MG tablet Take 10 mg by mouth daily.  (Patient not taking: Reported  on 12/12/2020)  . lithium carbonate 300 MG capsule Take 600 mg by mouth at bedtime.  (Patient not taking: Reported on 12/12/2020)  . methotrexate (RHEUMATREX) 2.5 MG tablet Take 7.5 mg by mouth once a week. Take 6 tablets every Saturday (Patient not taking: Reported on 12/12/2020)  . OLANZapine (ZYPREXA) 10 MG tablet Take 10 mg by mouth at bedtime. (Patient not taking: Reported on 12/12/2020)   No facility-administered encounter medications on file as of 12/12/2020.    Review of Systems  Constitutional: Negative for appetite change and unexpected weight change.  HENT: Negative for congestion and sinus pressure.   Respiratory: Negative for cough, chest tightness and shortness of breath.   Cardiovascular: Negative for chest pain, palpitations and leg swelling.  Gastrointestinal: Negative for abdominal pain, diarrhea,  nausea and vomiting.  Genitourinary: Negative for difficulty urinating and dysuria.  Musculoskeletal: Negative for joint swelling and myalgias.  Skin: Negative for color change and rash.  Neurological: Negative for dizziness, light-headedness and headaches.  Psychiatric/Behavioral: Negative for agitation and dysphoric mood.       Objective:    Physical Exam Vitals reviewed.  Constitutional:      General: He is not in acute distress.    Appearance: Normal appearance. He is well-developed.  HENT:     Head: Normocephalic and atraumatic.     Right Ear: External ear normal.     Left Ear: External ear normal.  Eyes:     General: No scleral icterus.       Right eye: No discharge.        Left eye: No discharge.     Conjunctiva/sclera: Conjunctivae normal.  Cardiovascular:     Rate and Rhythm: Normal rate and regular rhythm.  Pulmonary:     Effort: Pulmonary effort is normal. No respiratory distress.     Breath sounds: Normal breath sounds.  Abdominal:     General: Bowel sounds are normal.     Palpations: Abdomen is soft.     Tenderness: There is no abdominal tenderness.  Musculoskeletal:        General: No swelling or tenderness.     Cervical back: Neck supple. No tenderness.  Lymphadenopathy:     Cervical: No cervical adenopathy.  Skin:    Findings: No erythema or rash.  Neurological:     Mental Status: He is alert.  Psychiatric:        Mood and Affect: Mood normal.        Behavior: Behavior normal.     BP 120/80   Pulse (!) 50   Temp (!) 97.5 F (36.4 C)   Ht 5\' 7"  (1.702 m)   Wt 212 lb 4 oz (96.3 kg)   SpO2 99%   BMI 33.24 kg/m  Wt Readings from Last 3 Encounters:  12/12/20 212 lb 4 oz (96.3 kg)  05/29/17 201 lb 12.8 oz (91.5 kg)  02/21/17 200 lb 12.8 oz (91.1 kg)     Lab Results  Component Value Date   WBC 9.0 03/09/2015   HGB 11.9 (L) 03/09/2015   HCT 36.0 (L) 03/09/2015   PLT 210.0 03/09/2015   GLUCOSE 88 04/19/2015   CHOL 226 (A) 03/07/2016    TRIG 232 (A) 03/07/2016   HDL 33 (A) 03/07/2016   LDLCALC 147 03/07/2016   ALT 55 (A) 08/16/2015   AST 17 08/16/2015   NA 135 04/21/2015   K 4.4 04/19/2015   CL 95 (L) 04/19/2015   CREATININE 1.60 (H) 04/19/2015   BUN 19 04/19/2015  CO2 27 04/19/2015   TSH 3.032 03/04/2015   HGBA1C 5.5 11/22/2010       Assessment & Plan:   Problem List Items Addressed This Visit    Abnormal liver function tests    Previously worked up at the [place]. Felt to be related to lithium and hctz.  Off hctz and lithium.        Anemia    New baseline hgb is 10.  Followed by hematology.       CKD (chronic kidney disease) stage 4, GFR 15-29 ml/min (HCC) - Primary    Followed by nephrology.  Creatinine baseline now - 5 since 04/2019.  Avoid antiinflammatories and other nephrotoxic agents.        Essential hypertension, benign    Blood pressure improved.  Continue coreg and amlodipine.  Follow pressures.  Follow metabolic panel.        Hypercholesterolemia    Low cholesterol diet and exercise.  Follow lipid panel.        Mild depression ([place])    Doing well.  Followed by psychiatry.       Polymyositis (H[facility])    Off MTX.  Legs better. Still some discomfort, but improved.  Stable.            Einar Pheasant, MD

## 2020-12-17 ENCOUNTER — Encounter: Payer: Self-pay | Admitting: Internal Medicine

## 2020-12-17 NOTE — Assessment & Plan Note (Signed)
Previously worked up at the [place]. Felt to be related to lithium and hctz.  Off hctz and lithium.

## 2020-12-17 NOTE — Assessment & Plan Note (Signed)
New baseline hgb is 10.  Followed by hematology.

## 2020-12-17 NOTE — Assessment & Plan Note (Signed)
Doing well.  Followed by psychiatry.  ?

## 2020-12-17 NOTE — Assessment & Plan Note (Signed)
Off MTX.  Legs better. Still some discomfort, but improved.  Stable.

## 2020-12-17 NOTE — Assessment & Plan Note (Signed)
Followed by nephrology.  Creatinine baseline now - 5 since 04/2019.  Avoid antiinflammatories and other nephrotoxic agents.

## 2020-12-17 NOTE — Assessment & Plan Note (Signed)
Blood pressure improved.  Continue coreg and amlodipine.  Follow pressures.  Follow metabolic panel.

## 2020-12-17 NOTE — Assessment & Plan Note (Signed)
Low cholesterol diet and exercise.  Follow lipid panel.   

## 2021-01-08 DIAGNOSIS — M79671 Pain in right foot: Secondary | ICD-10-CM | POA: Diagnosis not present

## 2021-02-02 ENCOUNTER — Telehealth: Payer: Self-pay | Admitting: Internal Medicine

## 2021-02-02 NOTE — Telephone Encounter (Signed)
I attempted to call patient, but he doesn't have his voice mail set up. Please schedule Medicare Annual Wellness Visit (AWV) in office.   If not able to come in office, please offer to do virtually or by telephone.   Last AWV:02/21/2017  Please schedule at anytime with Nurse Health Advisor.

## 2021-02-16 ENCOUNTER — Other Ambulatory Visit: Payer: Self-pay

## 2021-03-04 ENCOUNTER — Encounter: Payer: Self-pay | Admitting: Internal Medicine

## 2021-03-04 DIAGNOSIS — D472 Monoclonal gammopathy: Secondary | ICD-10-CM | POA: Insufficient documentation

## 2021-04-03 DIAGNOSIS — L309 Dermatitis, unspecified: Secondary | ICD-10-CM | POA: Diagnosis not present

## 2021-04-03 DIAGNOSIS — L638 Other alopecia areata: Secondary | ICD-10-CM | POA: Diagnosis not present

## 2021-04-03 DIAGNOSIS — Z79899 Other long term (current) drug therapy: Secondary | ICD-10-CM | POA: Diagnosis not present

## 2021-04-03 DIAGNOSIS — Z7689 Persons encountering health services in other specified circumstances: Secondary | ICD-10-CM | POA: Diagnosis not present

## 2021-04-24 ENCOUNTER — Encounter: Payer: Self-pay | Admitting: *Deleted

## 2021-04-27 DIAGNOSIS — R809 Proteinuria, unspecified: Secondary | ICD-10-CM | POA: Insufficient documentation

## 2021-04-27 DIAGNOSIS — E669 Obesity, unspecified: Secondary | ICD-10-CM | POA: Insufficient documentation

## 2021-04-27 DIAGNOSIS — F319 Bipolar disorder, unspecified: Secondary | ICD-10-CM | POA: Insufficient documentation

## 2021-04-27 DIAGNOSIS — N400 Enlarged prostate without lower urinary tract symptoms: Secondary | ICD-10-CM | POA: Insufficient documentation

## 2021-04-27 DIAGNOSIS — N251 Nephrogenic diabetes insipidus: Secondary | ICD-10-CM | POA: Insufficient documentation

## 2021-04-27 DIAGNOSIS — R519 Headache, unspecified: Secondary | ICD-10-CM | POA: Insufficient documentation

## 2021-04-30 ENCOUNTER — Telehealth (INDEPENDENT_AMBULATORY_CARE_PROVIDER_SITE_OTHER): Payer: Self-pay | Admitting: Gastroenterology

## 2021-04-30 ENCOUNTER — Other Ambulatory Visit: Payer: Self-pay

## 2021-04-30 DIAGNOSIS — Z1211 Encounter for screening for malignant neoplasm of colon: Secondary | ICD-10-CM

## 2021-04-30 MED ORDER — NA SULFATE-K SULFATE-MG SULF 17.5-3.13-1.6 GM/177ML PO SOLN: 1.0000 | Freq: Once | ORAL | 0 refills | Status: AC

## 2021-04-30 NOTE — Progress Notes (Signed)
Procedure has been rescheduled due to the 5th being a holiday. Patient requested change. Updated instructions sent.

## 2021-04-30 NOTE — Progress Notes (Signed)
Gastroenterology Pre-Procedure Review  Request Date: 05/29/21 Requesting Physician: Dr. Allen Norris  PATIENT REVIEW QUESTIONS: The patient responded to the following health history questions as indicated:    1. Are you having any GI issues? no 2. Do you have a personal history of Polyps? No polyps removed 10/25/2014. 3. Do you have a family history of Colon Cancer or Polyps? no 4. Diabetes Mellitus? no 5. Joint replacements in the past 12 months?no 6. Major health problems in the past 3 months?no 7. Any artificial heart valves, MVP, or defibrillator?no    MEDICATIONS & ALLERGIES:    Patient reports the following regarding taking any anticoagulation/antiplatelet therapy:   Plavix, Coumadin, Eliquis, Xarelto, Lovenox, Pradaxa, Brilinta, or Effient? no Aspirin? no  Patient confirms/reports the following medications:  Current Outpatient Medications  Medication Sig Dispense Refill   aMILoride (MIDAMOR) 5 MG tablet Take 10 mg by mouth daily.  (Patient not taking: Reported on 12/12/2020)     amLODipine (NORVASC) 5 MG tablet Take 10 mg by mouth in the morning and at bedtime.     Calcium Citrate-Vitamin D (CALCIUM + D PO) Take by mouth.     carvedilol (COREG) 12.5 MG tablet Take 12.5 mg by mouth 2 (two) times daily with a meal.     FOLIC ACID PO Take by mouth.     Omega-3 Fatty Acids (FISH OIL PO) Take 1 capsule by mouth 2 (two) times daily.      tamsulosin (FLOMAX) 0.4 MG CAPS capsule Take 0.8 mg by mouth daily.      No current facility-administered medications for this visit.    Patient confirms/reports the following allergies:  Allergies  Allergen Reactions   Atorvastatin Other (See Comments)    No orders of the defined types were placed in this encounter.   AUTHORIZATION INFORMATION Primary Insurance: 1D#: Group #:  Secondary Insurance: 1D#: Group #:  SCHEDULE INFORMATION: Date: 05/29/21 Time: Location: [place]

## 2021-05-22 DIAGNOSIS — L638 Other alopecia areata: Secondary | ICD-10-CM | POA: Diagnosis not present

## 2021-05-29 ENCOUNTER — Telehealth: Payer: Self-pay | Admitting: Gastroenterology

## 2021-05-29 MED ORDER — GOLYTELY 236 G PO SOLR: 4000.0000 mL | Freq: Once | ORAL | 0 refills | Status: AC

## 2021-05-29 NOTE — Telephone Encounter (Signed)
Pt. Is having a procedure but he says that his insurance company will not pay for the bowel prep. He is requesting a call back to see what he should do about it

## 2021-05-29 NOTE — Telephone Encounter (Signed)
Sent golytely to the pharmacy and called and went over instructions with patient and sent to [facility]

## 2021-06-04 ENCOUNTER — Encounter: Payer: Self-pay | Admitting: Gastroenterology

## 2021-06-05 ENCOUNTER — Ambulatory Visit
Admission: RE | Admit: 2021-06-05 | Discharge: 2021-06-05 | Disposition: A | Discharge status: Home / Self Care | Payer: No Typology Code available for payment source | Attending: Gastroenterology | Admitting: Gastroenterology

## 2021-06-05 ENCOUNTER — Ambulatory Visit: Payer: No Typology Code available for payment source | Admitting: Certified Registered"

## 2021-06-05 ENCOUNTER — Encounter
Admission: RE | Disposition: A | Discharge status: Home / Self Care | Payer: Self-pay | Source: Home / Self Care | Attending: Gastroenterology

## 2021-06-05 ENCOUNTER — Encounter: Payer: Self-pay | Admitting: Gastroenterology

## 2021-06-05 DIAGNOSIS — Z1211 Encounter for screening for malignant neoplasm of colon: Secondary | ICD-10-CM

## 2021-06-05 DIAGNOSIS — Z79899 Other long term (current) drug therapy: Secondary | ICD-10-CM | POA: Insufficient documentation

## 2021-06-05 DIAGNOSIS — Z87891 Personal history of nicotine dependence: Secondary | ICD-10-CM | POA: Insufficient documentation

## 2021-06-05 DIAGNOSIS — K5730 Diverticulosis of large intestine without perforation or abscess without bleeding: Secondary | ICD-10-CM | POA: Insufficient documentation

## 2021-06-05 DIAGNOSIS — K641 Second degree hemorrhoids: Secondary | ICD-10-CM | POA: Insufficient documentation

## 2021-06-05 DIAGNOSIS — N400 Enlarged prostate without lower urinary tract symptoms: Secondary | ICD-10-CM | POA: Insufficient documentation

## 2021-06-05 DIAGNOSIS — Z8249 Family history of ischemic heart disease and other diseases of the circulatory system: Secondary | ICD-10-CM | POA: Insufficient documentation

## 2021-06-05 DIAGNOSIS — I10 Essential (primary) hypertension: Secondary | ICD-10-CM | POA: Diagnosis not present

## 2021-06-05 HISTORY — PX: COLONOSCOPY WITH PROPOFOL: SHX5780

## 2021-06-05 SURGERY — COLONOSCOPY WITH PROPOFOL
Anesthesia: General

## 2021-06-05 MED ORDER — LIDOCAINE HCL (CARDIAC) PF 100 MG/5ML IV SOSY
PREFILLED_SYRINGE | INTRAVENOUS | Status: DC | PRN
  Administered 2021-06-05: 100 mg via INTRAVENOUS

## 2021-06-05 MED ORDER — PROPOFOL 500 MG/50ML IV EMUL
INTRAVENOUS | Status: DC | PRN
  Administered 2021-06-05: 130 ug/kg/min via INTRAVENOUS

## 2021-06-05 MED ORDER — SODIUM CHLORIDE 0.9 % IV SOLN: INTRAVENOUS | Status: DC

## 2021-06-05 MED ORDER — PROPOFOL 10 MG/ML IV BOLUS
INTRAVENOUS | Status: DC | PRN
  Administered 2021-06-05: 80 mg via INTRAVENOUS

## 2021-06-05 NOTE — Anesthesia Preprocedure Evaluation (Signed)
Anesthesia Evaluation  Patient identified by MRN, date of birth, ID band Patient awake    Reviewed: Allergy & Precautions, NPO status , Patient's Chart, lab work & pertinent test results  Airway Mallampati: III  TM Distance: >3 FB Neck ROM: full    Dental no notable dental hx.    Pulmonary neg pulmonary ROS, former smoker,    Pulmonary exam normal        Cardiovascular hypertension, negative cardio ROS Normal cardiovascular exam     Neuro/Psych negative neurological ROS  negative psych ROS   GI/Hepatic negative GI ROS, Neg liver ROS,   Endo/Other  negative endocrine ROS  Renal/GU negative Renal ROS  negative genitourinary   Musculoskeletal   Abdominal   Peds  Hematology negative hematology ROS (+)   Anesthesia Other Findings Past Medical History: No date: Arthritis No date: Benign prostatic hypertrophy No date: Depression     Comment:  With psychotic features No date: Hypertension  Past Surgical History: 11/06/13: CYST EXCISION; Left     Comment:  ganglion cyst removed left hand  BMI    Body Mass Index: 32.25 kg/m      Reproductive/Obstetrics negative OB ROS                             Anesthesia Physical Anesthesia Plan  ASA: 2  Anesthesia Plan: General   Post-op Pain Management:    Induction:   PONV Risk Score and Plan: Propofol infusion and TIVA  Airway Management Planned:   Additional Equipment:   Intra-op Plan:   Post-operative Plan:   Informed Consent: I have reviewed the patients History and Physical, chart, labs and discussed the procedure including the risks, benefits and alternatives for the proposed anesthesia with the patient or authorized representative who has indicated his/her understanding and acceptance.     Dental Advisory Given  Plan Discussed with: Anesthesiologist, CRNA and Surgeon  Anesthesia Plan Comments:         Anesthesia  Quick Evaluation

## 2021-06-05 NOTE — Op Note (Signed)
[facility] Gastroenterology Patient Name: Donald Small Procedure Date: 06/05/2021 9:44 AM MRN: [id number] Account #: [id number] Date of Birth: 30-Jun-1961 Admit Type: Outpatient Age: 60 Room: [facility] ROOM 4 Gender: Male Note Status: Finalized Instrument Name: Jasper Riling [id number] Procedure:             Colonoscopy Indications:           Screening for colorectal malignant neoplasm Providers:             Lucilla Lame MD, MD Referring MD:          Einar Pheasant, MD (Referring MD) Medicines:             Propofol per Anesthesia Complications:         No immediate complications. Procedure:             Pre-Anesthesia Assessment:                        - Prior to the procedure, a History and Physical was                         performed, and patient medications and allergies were                         reviewed. The patient's tolerance of previous                         anesthesia was also reviewed. The risks and benefits                         of the procedure and the sedation options and risks                         were discussed with the patient. All questions were                         answered, and informed consent was obtained. Prior                         Anticoagulants: The patient has taken no previous                         anticoagulant or antiplatelet agents. ASA Grade                         Assessment: II - A patient with mild systemic disease.                         After reviewing the risks and benefits, the patient                         was deemed in satisfactory condition to undergo the                         procedure.                        After obtaining informed consent, the colonoscope was  passed under direct vision. Throughout the procedure,                         the patient's blood pressure, pulse, and oxygen                         saturations were monitored continuously. The                          Colonoscope was introduced through the anus and                         advanced to the the cecum, identified by appendiceal                         orifice and ileocecal valve. The colonoscopy was                         performed without difficulty. The patient tolerated                         the procedure well. The quality of the bowel                         preparation was adequate to identify polyps. Findings:      The perianal and digital rectal examinations were normal.      A few small-mouthed diverticula were found in the entire colon.      Non-bleeding internal hemorrhoids were found during retroflexion. The       hemorrhoids were Grade II (internal hemorrhoids that prolapse but reduce       spontaneously). Impression:            - Diverticulosis in the entire examined colon.                        - Non-bleeding internal hemorrhoids.                        - No specimens collected. Recommendation:        - Discharge patient to home.                        - Resume previous diet.                        - Repeat colonoscopy in 10 years for screening                         purposes. Procedure Code(s):     --- Professional ---                        [phone], Colonoscopy, flexible; diagnostic, including                         collection of specimen(s) by brushing or washing, when                         performed (separate procedure) Diagnosis Code(s):     --- Professional ---  Z12.11, Encounter for screening for malignant neoplasm                         of colon                        K64.1, Second degree hemorrhoids CPT copyright 2019 American Medical Association. All rights reserved. The codes documented in this report are preliminary and upon coder review may  be revised to meet current compliance requirements. Lucilla Lame MD, MD 06/05/2021 10:14:42 AM This report has been signed electronically. Number of Addenda: 0 Note Initiated On: 06/05/2021  9:44 AM Scope Withdrawal Time: 0 hours 12 minutes 31 seconds  Total Procedure Duration: 0 hours 16 minutes 47 seconds  Estimated Blood Loss:  Estimated blood loss: none.      [facility]

## 2021-06-05 NOTE — Anesthesia Postprocedure Evaluation (Signed)
Anesthesia Post Note  Patient: Donald Small  Procedure(s) Performed: COLONOSCOPY WITH PROPOFOL  Patient location during evaluation: Endoscopy Anesthesia Type: General Level of consciousness: awake and alert Pain management: pain level controlled Vital Signs Assessment: post-procedure vital signs reviewed and stable Respiratory status: spontaneous breathing, nonlabored ventilation, respiratory function stable and patient connected to nasal cannula oxygen Cardiovascular status: blood pressure returned to baseline and stable Postop Assessment: no apparent nausea or vomiting Anesthetic complications: no   No notable events documented.   Last Vitals:  Vitals:   06/05/21 0840 06/05/21 1015  BP: (!) 167/103 (!) 132/93  Pulse: 65   Resp: 16   Temp: (!) 36.3 C (!) 36.1 C  SpO2: 100%     Last Pain:  Vitals:   06/05/21 1015  TempSrc: Temporal  PainSc: Asleep                 Margaree Mackintosh

## 2021-06-05 NOTE — H&P (Signed)
Lucilla Lame, MD [address] [place], [place] [address] Phone: [phone] Fax : [phone]  Primary Care Physician:  Einar Pheasant, MD Primary Gastroenterologist:  Dr. Allen Norris  Pre-Procedure History & Physical: HPI:  Donald Small is a 60 y.o. male is here for a screening colonoscopy.   Past Medical History:  Diagnosis Date   Arthritis    Benign prostatic hypertrophy    Depression    With psychotic features   Hypertension     Past Surgical History:  Procedure Laterality Date   CYST EXCISION Left 11/06/13   ganglion cyst removed left hand    Prior to Admission medications   Medication Sig Start Date End Date Taking? Authorizing Provider  amLODipine (NORVASC) 5 MG tablet Take 10 mg by mouth in the morning and at bedtime.   Yes [provider]  carvedilol (COREG) 12.5 MG tablet Take 12.5 mg by mouth 2 (two) times daily with a meal.   Yes [provider]  hydrALAZINE (APRESOLINE) 100 MG tablet Take 1 tablet by mouth 2 (two) times daily. 04/13/21  Yes [provider]  tamsulosin (FLOMAX) 0.4 MG CAPS capsule Take 0.8 mg by mouth daily.    Yes [provider]  aMILoride (MIDAMOR) 5 MG tablet Take 10 mg by mouth daily.  Patient not taking: No sig reported    [provider]  Calcium Citrate-Vitamin D (CALCIUM + D PO) Take by mouth.    [provider]  FOLIC ACID PO Take by mouth.    [provider]  Omega-3 Fatty Acids (FISH OIL PO) Take 1 capsule by mouth 2 (two) times daily.     [provider]    Allergies as of 04/30/2021   (No Known Allergies)    Family History  Problem Relation Age of Onset   Hypertension Mother    Arthritis Father    Hypertension Father    Cancer Father        prostate cancer   Stroke Father    Diabetes Brother     Social History   Socioeconomic History   Marital status: Married    Spouse name: Not on file   Number of children: Not on file    Years of education: Not on file   Highest education level: Not on file  Occupational History   Not on file  Tobacco Use   Smoking status: Former    Packs/day: 2.00    Years: 15.00    Pack years: 30.00    Types: Cigarettes    Quit date: 09/23/1988    Years since quitting: 32.7   Smokeless tobacco: Never  Vaping Use   Vaping Use: Never used  Substance and Sexual Activity   Alcohol use: No    Alcohol/week: 0.0 standard drinks   Drug use: No   Sexual activity: Yes  Other Topics Concern   Not on file  Social History Narrative   Not on file   Social Determinants of Health   Financial Resource Strain: Not on file  Food Insecurity: Not on file  Transportation Needs: Not on file  Physical Activity: Not on file  Stress: Not on file  Social Connections: Not on file  Intimate Partner Violence: Not on file    Review of Systems: See HPI, otherwise negative ROS  Physical Exam: BP (!) 167/103   Pulse 65   Temp (!) 97.3 F (36.3 C) (Temporal)   Resp 16   Ht 5' 7.5" (1.715 m)  Wt 94.8 kg   SpO2 100%   BMI 32.25 kg/m  General:   Alert,  pleasant and cooperative in NAD Head:  Normocephalic and atraumatic. Neck:  Supple; no masses or thyromegaly. Lungs:  Clear throughout to auscultation.    Heart:  Regular rate and rhythm. Abdomen:  Soft, nontender and nondistended. Normal bowel sounds, without guarding, and without rebound.   Neurologic:  Alert and  oriented x4;  grossly normal neurologically.  Impression/Plan: Donald Small is now here to undergo a screening colonoscopy.  Risks, benefits, and alternatives regarding colonoscopy have been reviewed with the patient.  Questions have been answered.  All parties agreeable.

## 2021-06-05 NOTE — Transfer of Care (Signed)
Immediate Anesthesia Transfer of Care Note  Patient: Donald Small  Procedure(s) Performed: COLONOSCOPY WITH PROPOFOL  Patient Location: PACU and Endoscopy Unit  Anesthesia Type:General  Level of Consciousness: drowsy  Airway & Oxygen Therapy: Patient Spontanous Breathing  Post-op Assessment: Report given to RN  Post vital signs: stable  Last Vitals:  Vitals Value Taken Time  BP 136/110 06/05/21 1017  Temp    Pulse 73 06/05/21 1018  Resp 13 06/05/21 1018  SpO2 95 % 06/05/21 1018  Vitals shown include unvalidated device data.  Last Pain:  Vitals:   06/05/21 0840  TempSrc: Temporal         Complications: No notable events documented.

## 2021-06-06 ENCOUNTER — Encounter: Payer: Self-pay | Admitting: Gastroenterology

## 2021-06-14 ENCOUNTER — Other Ambulatory Visit: Payer: Self-pay

## 2021-06-14 ENCOUNTER — Ambulatory Visit (INDEPENDENT_AMBULATORY_CARE_PROVIDER_SITE_OTHER): Payer: Medicare Other | Admitting: Internal Medicine

## 2021-06-14 VITALS — BP 128/83 | HR 67 | Temp 97.6°F | Ht 67.5 in | Wt 202.6 lb

## 2021-06-14 DIAGNOSIS — N184 Chronic kidney disease, stage 4 (severe): Secondary | ICD-10-CM | POA: Diagnosis not present

## 2021-06-14 DIAGNOSIS — Z0000 Encounter for general adult medical examination without abnormal findings: Secondary | ICD-10-CM

## 2021-06-14 DIAGNOSIS — M3320 Polymyositis, organ involvement unspecified: Secondary | ICD-10-CM | POA: Diagnosis not present

## 2021-06-14 DIAGNOSIS — F319 Bipolar disorder, unspecified: Secondary | ICD-10-CM

## 2021-06-14 DIAGNOSIS — I10 Essential (primary) hypertension: Secondary | ICD-10-CM

## 2021-06-14 DIAGNOSIS — D649 Anemia, unspecified: Secondary | ICD-10-CM

## 2021-06-14 DIAGNOSIS — E7800 Pure hypercholesterolemia, unspecified: Secondary | ICD-10-CM

## 2021-06-14 NOTE — Progress Notes (Signed)
Pre visit review using our clinic review tool, if applicable. No additional management support is needed unless otherwise documented below in the visit note. 

## 2021-06-14 NOTE — Progress Notes (Signed)
Patient ID: Donald Small, male   DOB: 1961/04/05, 60 y.o.   MRN: [id number]   Subjective:    Patient ID: Donald Small, male    DOB: 1961-02-02, 60 y.o.   MRN: [id number]  This visit occurred during the SARS-CoV-2 public health emergency.  Safety protocols were in place, including screening questions prior to the visit, additional usage of staff PPE, and extensive cleaning of exam room while observing appropriate contact time as indicated for disinfecting solutions.   Patient here for his physical exam.   Chief Complaint  Patient presents with   Annual Exam   .   HPI He reports doing relatively well.  Handling stress.  Seeing psychiatry and appears to be doing well.  Off lithium. Trying to stay active.  No chest pain or sob reported.  No abdominal pain or bowel change reported.  Per note - colonoscopy 06/05/21 - diverticulosis and non bleeding internal hemorrhoids. Off methotrexate.  Seeing rheumatology.  Stable.  On transplant list - kidney - waiting for kidney transplant.     Past Medical History:  Diagnosis Date   Arthritis    Benign prostatic hypertrophy    Depression    With psychotic features   Hypertension    Past Surgical History:  Procedure Laterality Date   COLONOSCOPY WITH PROPOFOL N/A 06/05/2021   Procedure: COLONOSCOPY WITH PROPOFOL;  Surgeon: Lucilla Lame, MD;  [facility] ENDOSCOPY;  Service: Endoscopy;  Laterality: N/A;   CYST EXCISION Left 11/06/13   ganglion cyst removed left hand   Family History  Problem Relation Age of Onset   Hypertension Mother    Arthritis Father    Hypertension Father    Cancer Father        prostate cancer   Stroke Father    Diabetes Brother    Social History   Socioeconomic History   Marital status: Married    Spouse name: Not on file   Number of children: Not on file   Years of education: Not on file   Highest education level: Not on file  Occupational History   Not on file  Tobacco Use   Smoking status:  Former    Packs/day: 2.00    Years: 15.00    Pack years: 30.00    Types: Cigarettes    Quit date: 09/23/1988    Years since quitting: 32.7   Smokeless tobacco: Never  Vaping Use   Vaping Use: Never used  Substance and Sexual Activity   Alcohol use: No    Alcohol/week: 0.0 standard drinks   Drug use: No   Sexual activity: Yes  Other Topics Concern   Not on file  Social History Narrative   Not on file   Social Determinants of Health   Financial Resource Strain: Not on file  Food Insecurity: Not on file  Transportation Needs: Not on file  Physical Activity: Not on file  Stress: Not on file  Social Connections: Not on file    Review of Systems  Constitutional:  Negative for appetite change and unexpected weight change.  HENT:  Negative for congestion, sinus pressure and sore throat.   Eyes:  Negative for pain and visual disturbance.  Respiratory:  Negative for cough, chest tightness and shortness of breath.   Cardiovascular:  Negative for chest pain, palpitations and leg swelling.  Gastrointestinal:  Negative for abdominal pain, diarrhea, nausea and vomiting.  Genitourinary:  Negative for difficulty urinating and dysuria.  Musculoskeletal:  Negative for joint swelling and myalgias.  Skin:  Negative for color change and rash.  Neurological:  Negative for dizziness, light-headedness and headaches.  Hematological:  Negative for adenopathy. Does not bruise/bleed easily.  Psychiatric/Behavioral:  Negative for agitation and dysphoric mood.       Objective:     BP 128/83   Pulse 67   Temp 97.6 F (36.4 C) (Skin)   Ht 5' 7.5" (1.715 m)   Wt 202 lb 9.6 oz (91.9 kg)   SpO2 99%   BMI 31.26 kg/m  Wt Readings from Last 3 Encounters:  06/14/21 202 lb 9.6 oz (91.9 kg)  06/05/21 209 lb (94.8 kg)  12/12/20 212 lb 4 oz (96.3 kg)    Physical Exam Vitals reviewed.  Constitutional:      General: He is not in acute distress.    Appearance: He is well-developed.  HENT:      Head: Normocephalic and atraumatic.     Right Ear: External ear normal.     Left Ear: External ear normal.  Eyes:     General: No scleral icterus.       Right eye: No discharge.        Left eye: No discharge.     Conjunctiva/sclera: Conjunctivae normal.  Neck:     Thyroid: No thyromegaly.  Cardiovascular:     Rate and Rhythm: Normal rate and regular rhythm.  Pulmonary:     Effort: No respiratory distress.     Breath sounds: Normal breath sounds. No wheezing.  Abdominal:     General: Bowel sounds are normal.     Palpations: Abdomen is soft.     Tenderness: There is no abdominal tenderness.  Genitourinary:    Comments: Not performed.  Musculoskeletal:        General: No tenderness.     Cervical back: Neck supple. No tenderness.  Lymphadenopathy:     Cervical: No cervical adenopathy.  Skin:    Findings: No erythema or rash.  Neurological:     Mental Status: He is alert and oriented to person, place, and time.  Psychiatric:        Behavior: Behavior normal.     Outpatient Encounter Medications as of 06/14/2021  Medication Sig   amLODipine (NORVASC) 5 MG tablet Take 10 mg by mouth in the morning and at bedtime.   Calcium Citrate-Vitamin D (CALCIUM + D PO) Take by mouth.   carvedilol (COREG) 12.5 MG tablet Take 12.5 mg by mouth 2 (two) times daily with a meal.   FOLIC ACID PO Take by mouth.   hydrALAZINE (APRESOLINE) 100 MG tablet Take 1 tablet by mouth 2 (two) times daily.   Omega-3 Fatty Acids (FISH OIL PO) Take 1 capsule by mouth 2 (two) times daily.    tamsulosin (FLOMAX) 0.4 MG CAPS capsule Take 0.8 mg by mouth daily.    aMILoride (MIDAMOR) 5 MG tablet Take 10 mg by mouth daily.  (Patient not taking: Reported on 06/14/2021)   No facility-administered encounter medications on file as of 06/14/2021.     Lab Results  Component Value Date   WBC 4.7 12/05/2020   HGB 10.4 (A) 12/05/2020   HCT 29 (A) 12/05/2020   PLT 118 (A) 12/05/2020   GLUCOSE 88 04/19/2015   CHOL 226  (A) 03/07/2016   TRIG 232 (A) 03/07/2016   HDL 33 (A) 03/07/2016   LDLCALC 147 03/07/2016   ALT 55 (A) 08/16/2015   AST 17 08/16/2015   NA 133 (A) 12/05/2020   K 4.0 12/05/2020   CL  103 12/05/2020   CREATININE 5.8 (A) 12/05/2020   BUN 60 (A) 12/05/2020   CO2 25 (A) 12/05/2020   TSH 3.032 03/04/2015   HGBA1C 5.5 11/22/2010       Assessment & Plan:   Problem List Items Addressed This Visit     Anemia    Followed by hematology.  Baseline hgb 10.        Bipolar disorder, unspecified ([place])    Followed by psychiatry.  Off lithium.        CKD (chronic kidney disease) stage 4, GFR 15-29 ml/min (HCC)    Followed by nephrology.  On transplant list.  Avoid antiinflammatories and other nephrotoxic agents.       Essential hypertension, benign    Blood pressure improved.  Continue hydralazine, coreg and amlodipine.  Follow pressures.  Follow metabolic panel.  Blood pressure at home 136/84.       Health care maintenance    03/29/2021 10:38 SERUM HEPATITIS C ANTIBNegative Ref: Negative. Colonoscopy 10/2014 - diverticulosis.  repeat colonoscopy 5-10 years.  07/14/2020 FECES OCCULT BLOOD FIT Negative NEG.  Prostates checks through [place].  colonoscopy 06/05/21 - diverticulosis and non bleeding internal hemorrhoids.       Hypercholesterolemia    Low cholesterol diet and exercise.  Follow lipid panel.        Polymyositis ([place])    Off methotrexate.  Overall stable.  Follow.        Other Visit Diagnoses     Routine general medical examination at a health care facility    -  Primary        Einar Pheasant, MD

## 2021-06-19 ENCOUNTER — Encounter: Payer: Self-pay | Admitting: Nurse Practitioner

## 2021-06-19 LAB — HEPATIC FUNCTION PANEL
ALT: 41 — AB (ref 10–40)
AST: 13 — AB (ref 14–40)
Alkaline Phosphatase: 73 (ref 25–125)
Bilirubin, Direct: 0.1 (ref 0.01–0.4)
Bilirubin, Total: 0.3

## 2021-06-19 LAB — BASIC METABOLIC PANEL
BUN: 80 — AB (ref 4–21)
CO2: 25 — AB (ref 13–22)
Chloride: 108 (ref 99–108)
Creatinine: 6.9 — AB (ref 0.6–1.3)
Glucose: 113
Potassium: 4 (ref 3.4–5.3)
Sodium: 140 (ref 137–147)

## 2021-06-19 LAB — CBC AND DIFFERENTIAL
HCT: 31 — AB (ref 41–53)
Hemoglobin: 10.9 — AB (ref 13.5–17.5)
Platelets: 186 (ref 150–399)
WBC: 5.9

## 2021-06-19 LAB — HEMOGLOBIN A1C: Hemoglobin A1C: 5.7

## 2021-06-19 LAB — COMPREHENSIVE METABOLIC PANEL
Calcium: 8.9 (ref 8.7–10.7)
GFR calc Af Amer: 8

## 2021-06-19 LAB — CBC: RBC: 3.34 — AB (ref 3.87–5.11)

## 2021-06-19 LAB — TSH: TSH: 1.54 (ref 0.41–5.90)

## 2021-06-19 LAB — LIPID PANEL
Cholesterol: 118 (ref 0–200)
HDL: 54 (ref 35–70)
LDL Cholesterol: 51
Triglycerides: 63 (ref 40–160)

## 2021-06-23 ENCOUNTER — Encounter: Payer: Self-pay | Admitting: Internal Medicine

## 2021-06-23 NOTE — Assessment & Plan Note (Signed)
Followed by nephrology.  On transplant list.  Avoid antiinflammatories and other nephrotoxic agents.

## 2021-06-23 NOTE — Assessment & Plan Note (Addendum)
03/29/2021 10:38 SERUM HEPATITIS C ANTIBNegative Ref: Negative. Colonoscopy 10/2014 - diverticulosis.  repeat colonoscopy 5-10 years.  07/14/2020 FECES OCCULT BLOOD FIT Negative NEG.  Prostates checks through [place].  colonoscopy 06/05/21 - diverticulosis and non bleeding internal hemorrhoids.

## 2021-06-23 NOTE — Assessment & Plan Note (Signed)
Low cholesterol diet and exercise.  Follow lipid panel.   

## 2021-06-23 NOTE — Assessment & Plan Note (Signed)
Blood pressure improved.  Continue hydralazine, coreg and amlodipine.  Follow pressures.  Follow metabolic panel.  Blood pressure at home 136/84.

## 2021-06-23 NOTE — Assessment & Plan Note (Signed)
Off methotrexate.  Overall stable.  Follow.   ?

## 2021-06-23 NOTE — Assessment & Plan Note (Signed)
Followed by psychiatry.  Off lithium.   

## 2021-06-23 NOTE — Assessment & Plan Note (Signed)
Followed by hematology. Baseline hgb 10.  ?

## 2021-06-26 ENCOUNTER — Telehealth: Payer: Self-pay | Admitting: Internal Medicine

## 2021-06-26 NOTE — Telephone Encounter (Signed)
Patient dropped off lab results for Dr Nicki Reaper to review. Results up front in Dr Bary Leriche color folder.

## 2021-06-26 NOTE — Telephone Encounter (Signed)
Labs are in your folder for review. Will abstract after you review.

## 2021-06-27 NOTE — Telephone Encounter (Signed)
Nephrology ordered.  Please abstract.

## 2021-06-28 NOTE — Telephone Encounter (Signed)
Labs abstracted 

## 2021-07-12 ENCOUNTER — Ambulatory Visit (INDEPENDENT_AMBULATORY_CARE_PROVIDER_SITE_OTHER): Payer: No Typology Code available for payment source

## 2021-07-12 VITALS — BP 117/78 | HR 63 | Ht 67.5 in | Wt 202.0 lb

## 2021-07-12 DIAGNOSIS — Z0000 Encounter for general adult medical examination without abnormal findings: Secondary | ICD-10-CM

## 2021-07-12 NOTE — Progress Notes (Signed)
Subjective:   Kenya Shiraishi is a 60 y.o. male who presents for Medicare Annual/Subsequent preventive examination.  Review of Systems    No ROS.  Medicare Wellness Virtual Visit.  Visual/audio telehealth visit, UTA vital signs.   See social history for additional risk factors.   Cardiac Risk Factors include: advanced age (>30men, >58 women);male gender     Objective:    Today's Vitals   07/12/21 0950  BP: 117/78  Pulse: 63  Weight: 202 lb (91.6 kg)  Height: 5' 7.5" (1.715 m)   Body mass index is 31.17 kg/m.  Advanced Directives 07/12/2021 06/05/2021 02/21/2017 03/04/2015 03/03/2015  Does Patient Have a Medical Advance Directive? Yes Yes No No No  Type of Paramedic of [place];Living will [place];Living will - - -  Does patient want to make changes to medical advance directive? No - Patient declined - - - -  Copy of [place] in Chart? No - copy requested No - copy requested - - -  Would patient like information on creating a medical advance directive? - - Yes (MAU/Ambulatory/Procedural Areas - Information given) No - patient declined information No - patient declined information    Current Medications (verified) Outpatient Encounter Medications as of 07/12/2021  Medication Sig   aMILoride (MIDAMOR) 5 MG tablet Take 10 mg by mouth daily.  (Patient not taking: Reported on 06/14/2021)   amLODipine (NORVASC) 5 MG tablet Take 10 mg by mouth in the morning and at bedtime.   Calcium Citrate-Vitamin D (CALCIUM + D PO) Take by mouth.   carvedilol (COREG) 12.5 MG tablet Take 12.5 mg by mouth 2 (two) times daily with a meal.   FOLIC ACID PO Take by mouth.   hydrALAZINE (APRESOLINE) 100 MG tablet Take 1 tablet by mouth 2 (two) times daily.   Omega-3 Fatty Acids (FISH OIL PO) Take 1 capsule by mouth 2 (two) times daily.    tamsulosin (FLOMAX) 0.4 MG CAPS capsule Take 0.8 mg by mouth daily.    No facility-administered  encounter medications on file as of 07/12/2021.    Allergies (verified) Patient has no known allergies.   History: Past Medical History:  Diagnosis Date   Arthritis    Benign prostatic hypertrophy    Depression    With psychotic features   Hypertension    Past Surgical History:  Procedure Laterality Date   COLONOSCOPY WITH PROPOFOL N/A 06/05/2021   Procedure: COLONOSCOPY WITH PROPOFOL;  Surgeon: Lucilla Lame, MD;  Location[facility];  Service: Endoscopy;  Laterality: N/A;   CYST EXCISION Left 11/06/13   ganglion cyst removed left hand   Family History  Problem Relation Age of Onset   Hypertension Mother    Arthritis Father    Hypertension Father    Cancer Father        prostate cancer   Stroke Father    Diabetes Brother    Social History   Socioeconomic History   Marital status: Married    Spouse name: Not on file   Number of children: Not on file   Years of education: Not on file   Highest education level: Not on file  Occupational History   Not on file  Tobacco Use   Smoking status: Former    Packs/day: 2.00    Years: 15.00    Pack years: 30.00    Types: Cigarettes    Quit date: 09/23/1988    Years since quitting: 32.8   Smokeless tobacco:  Never  Vaping Use   Vaping Use: Never used  Substance and Sexual Activity   Alcohol use: No    Alcohol/week: 0.0 standard drinks   Drug use: No   Sexual activity: Yes  Other Topics Concern   Not on file  Social History Narrative   Not on file   Social Determinants of Health   Financial Resource Strain: Low Risk    Difficulty of Paying Living Expenses: Not hard at all  Food Insecurity: No Food Insecurity   Worried About Charity fundraiser in the Last Year: Never true   [place] in the Last Year: Never true  Transportation Needs: No Transportation Needs   Lack of Transportation (Medical): No   Lack of Transportation (Non-Medical): No  Physical Activity: Not on file  Stress: No Stress Concern  Present   Feeling of Stress : Not at all  Social Connections: Unknown   Frequency of Communication with Friends and Family: More than three times a week   Frequency of Social Gatherings with Friends and Family: More than three times a week   Attends Religious Services: Not on Electrical engineer or Organizations: Not on file   Attends Archivist Meetings: Not on file   Marital Status: Not on file    Tobacco Counseling Counseling given: Not Answered   Clinical Intake:  Pre-visit preparation completed: Yes        Diabetes: No  How often do you need to have someone help you when you read instructions, pamphlets, or other written materials from your doctor or pharmacy?: 1 - Never   Interpreter Needed?: No      Activities of Daily Living In your present state of health, do you have any difficulty performing the following activities: 07/12/2021  Hearing? N  Vision? N  Difficulty concentrating or making decisions? N  Walking or climbing stairs? N  Dressing or bathing? N  Doing errands, shopping? N  Preparing Food and eating ? N  Using the Toilet? N  In the past six months, have you accidently leaked urine? N  Do you have problems with loss of bowel control? N  Managing your Medications? N  Managing your Finances? N  Housekeeping or managing your Housekeeping? N  Some recent data might be hidden    Patient Care Team: Einar Pheasant, MD as PCP - General (Internal Medicine)  Indicate any recent Medical Services you may have received from other than [facility] providers in the past year (date may be approximate).     Assessment:   This is a routine wellness examination for [place].  I connected with [place] today by telephone and verified that I am speaking with the correct person using two identifiers. Location patient: home Location provider: work Persons participating in the virtual visit: patient, Marine scientist.    I discussed the limitations, risks,  security and privacy concerns of performing an evaluation and management service by telephone and the availability of in person appointments. The patient expressed understanding and verbally consented to this telephonic visit.    Interactive audio and video telecommunications were attempted between this provider and patient, however failed, due to patient having technical difficulties OR patient did not have access to video capability.  We continued and completed visit with audio only.  Some vital signs may be absent or patient reported.   Hearing/Vision screen Hearing Screening - Comments:: Patient is able to hear conversational tones without difficulty.  No issues reported. Vision Screening -  Comments:: Followed by [facility])  Wears corrective lenses when reading  They have regular follow up with the ophthalmologist  Dietary issues and exercise activities discussed: Current Exercise Habits: Home exercise routine, Type of exercise: calisthenics;treadmill, Time (Minutes): 30, Frequency (Times/Week): 3, Weekly Exercise (Minutes/Week): 90, Intensity: Moderate Healthy diet Lean meat Good water intake   Goals Addressed               This Visit's Progress     Patient Stated     Weight goal 190lb (pt-stated)        Stay active Stay hydrated Low carb foods       Depression Screen PHQ 2/9 Scores 07/12/2021 06/14/2021 12/12/2020 12/12/2020 02/21/2017 01/31/2014  PHQ - 2 Score 0 0 0 0 0 0  PHQ- 9 Score - - 0 - 0 -    Fall Risk Fall Risk  07/12/2021 06/14/2021 12/12/2020 02/21/2017  Falls in the past year? 0 0 0 No  Number falls in past yr: 0 0 - -  Injury with Fall? - 0 - -  Risk for fall due to : - No Fall Risks - -  Follow up Falls evaluation completed Falls evaluation completed Falls evaluation completed -    FALL RISK PREVENTION PERTAINING TO THE HOME: Adequate lighting in your home to reduce risk of falls? Yes   ASSISTIVE DEVICES UTILIZED TO PREVENT  FALLS: Use of a cane, walker or w/c? No   TIMED UP AND GO: Was the test performed? No . Virtual visit.  Cognitive Function: MMSE - Mini Mental State Exam 02/21/2017  Orientation to time 5  Orientation to Place 5  Registration 3  Attention/ Calculation 5  Recall 3  Language- name 2 objects 2  Language- repeat 1  Language- follow 3 step command 3  Language- read & follow direction 1  Write a sentence 1  Copy design 1  Total score 30     6CIT Screen 07/12/2021  What Year? 0 points  What month? 0 points  What time? 0 points  Count back from 20 0 points  Months in reverse 0 points  Repeat phrase 0 points  Total Score 0    Immunizations Immunization History  Administered Date(s) Administered   Hepatitis B, adult 05/11/2020, 06/12/2020   Influenza-Unspecified 07/18/1995   PFIZER(Purple Top)SARS-COV-2 Vaccination 04/24/2020, 05/15/2020   Pneumococcal-Unspecified 02/26/2008   Tdap 06/29/2013   Pneumococcal vaccine status: Due, Education has been provided regarding the importance of this vaccine. Advised may receive this vaccine at local pharmacy or Health Dept. Aware to provide a copy of the vaccination record if obtained from local pharmacy or Health Dept. Verbalized acceptance and understanding. Deferred.   Covid-19 vaccine status: Completed vaccines. Agrees to update immunization record.  Shingrix Completed?: No.    Education has been provided regarding the importance of this vaccine. Patient has been advised to call insurance company to determine out of pocket expense if they have not yet received this vaccine. Advised may also receive vaccine at local pharmacy or Health Dept. Verbalized acceptance and understanding.  Screening Tests Health Maintenance  Topic Date Due   COVID-19 Vaccine (3 - Pfizer risk series) 07/28/2021 (Originally 06/12/2020)   Zoster Vaccines- Shingrix (1 of 2) 10/12/2021 (Originally 07/03/1979)   INFLUENZA VACCINE  12/21/2021 (Originally 04/23/2021)    Pneumococcal Vaccine 39-36 Years old (1 - PCV) 07/12/2022 (Originally 07/02/1966)   HIV Screening  07/12/2022 (Originally 07/03/1975)   TETANUS/TDAP  06/30/2023   COLONOSCOPY (Pts 45-30yrs Insurance coverage will  need to be confirmed)  06/06/2031   Hepatitis C Screening  Completed   HPV VACCINES  Aged Out   Health Maintenance There are no preventive care reminders to display for this patient.  Vision Screening: Recommended annual ophthalmology exams for early detection of glaucoma and other disorders of the eye.  Dental Screening: Recommended annual dental exams for proper oral hygiene.  Community Resource Referral / Chronic Care Management: CRR required this visit?  No   CCM required this visit?  No      Plan:    I have personally reviewed and noted the following in the patient's chart:   Medical and social history Use of alcohol, tobacco or illicit drugs  Current medications and supplements including opioid prescriptions. Patient is not currently taking opioid prescriptions. Functional ability and status Nutritional status Physical activity Advanced directives List of other physicians Hospitalizations, surgeries, and ER visits in previous 12 months Vitals Screenings to include cognitive, depression, and falls Referrals and appointments  In addition, I have reviewed and discussed with patient certain preventive protocols, quality metrics, and best practice recommendations. A written personalized care plan for preventive services as well as general preventive health recommendations were provided to patient via mychart.     Varney Biles, LPN   [id number]

## 2021-07-12 NOTE — Patient Instructions (Addendum)
[name] , Thank you for taking time to come for your Medicare Wellness Visit. I appreciate your ongoing commitment to your health goals. Please review the following plan we discussed and let me know if I can assist you in the future.   These are the goals we discussed:  Goals       Patient Stated     Weight goal 190lb (pt-stated)      Stay active Stay hydrated Low carb foods        This is a list of the screening recommended for you and due dates:  Health Maintenance  Topic Date Due   COVID-19 Vaccine (3 - Pfizer risk series) 07/28/2021*   Zoster (Shingles) Vaccine (1 of 2) 10/12/2021*   Flu Shot  12/21/2021*   Pneumococcal Vaccination (1 - PCV) 07/12/2022*   HIV Screening  07/12/2022*   Tetanus Vaccine  06/30/2023   Colon Cancer Screening  06/06/2031   Hepatitis C Screening: USPSTF Recommendation to screen - Ages 18-79 yo.  Completed   HPV Vaccine  Aged Out  *Topic was postponed. The date shown is not the original due date.   Advanced directives: End of life planning; Advance aging; Advanced directives discussed.  Copy of current HCPOA/Living Will requested.    Conditions/risks identified: none new  Follow up in one year for your annual wellness visit.   Preventive Care 61 Years and Older, Male Preventive care refers to lifestyle choices and visits with your health care provider that can promote health and wellness. What does preventive care include? A yearly physical exam. This is also called an annual well check. Dental exams once or twice a year. Routine eye exams. Ask your health care provider how often you should have your eyes checked. Personal lifestyle choices, including: Daily care of your teeth and gums. Regular physical activity. Eating a healthy diet. Avoiding tobacco and drug use. Limiting alcohol use. Practicing safe sex. Taking low doses of aspirin every day. Taking vitamin and mineral supplements as recommended by your health care provider. What  happens during an annual well check? The services and screenings done by your health care provider during your annual well check will depend on your age, overall health, lifestyle risk factors, and family history of disease. Counseling  Your health care provider may ask you questions about your: Alcohol use. Tobacco use. Drug use. Emotional well-being. Home and relationship well-being. Sexual activity. Eating habits. History of falls. Memory and ability to understand (cognition). Work and work Statistician. Screening  You may have the following tests or measurements: Height, weight, and BMI. Blood pressure. Lipid and cholesterol levels. These may be checked every 5 years, or more frequently if you are over 20 years old. Skin check. Lung cancer screening. You may have this screening every year starting at age 33 if you have a 30-pack-year history of smoking and currently smoke or have quit within the past 15 years. Fecal occult blood test (FOBT) of the stool. You may have this test every year starting at age 19. Flexible sigmoidoscopy or colonoscopy. You may have a sigmoidoscopy every 5 years or a colonoscopy every 10 years starting at age 58. Prostate cancer screening. Recommendations will vary depending on your family history and other risks. Hepatitis C blood test. Hepatitis B blood test. Sexually transmitted disease (STD) testing. Diabetes screening. This is done by checking your blood sugar (glucose) after you have not eaten for a while (fasting). You may have this done every 1-3 years. Abdominal aortic aneurysm (AAA)  screening. You may need this if you are a current or former smoker. Osteoporosis. You may be screened starting at age 24 if you are at high risk. Talk with your health care provider about your test results, treatment options, and if necessary, the need for more tests. Vaccines  Your health care provider may recommend certain vaccines, such as: Influenza vaccine. This  is recommended every year. Tetanus, diphtheria, and acellular pertussis (Tdap, Td) vaccine. You may need a Td booster every 10 years. Zoster vaccine. You may need this after age 4. Pneumococcal 13-valent conjugate (PCV13) vaccine. One dose is recommended after age 33. Pneumococcal polysaccharide (PPSV23) vaccine. One dose is recommended after age 87. Talk to your health care provider about which screenings and vaccines you need and how often you need them. This information is not intended to replace advice given to you by your health care provider. Make sure you discuss any questions you have with your health care provider. Document Released: 10/06/2015 Document Revised: 05/29/2016 Document Reviewed: 07/11/2015 Elsevier Interactive Patient Education  2017 [place] Prevention in the Home Falls can cause injuries. They can happen to people of all ages. There are many things you can do to make your home safe and to help prevent falls. What can I do on the outside of my home? Regularly fix the edges of walkways and driveways and fix any cracks. Remove anything that might make you trip as you walk through a door, such as a raised step or threshold. Trim any bushes or trees on the path to your home. Use bright outdoor lighting. Clear any walking paths of anything that might make someone trip, such as rocks or tools. Regularly check to see if handrails are loose or broken. Make sure that both sides of any steps have handrails. Any raised decks and porches should have guardrails on the edges. Have any leaves, snow, or ice cleared regularly. Use sand or salt on walking paths during winter. Clean up any spills in your garage right away. This includes oil or grease spills. What can I do in the bathroom? Use night lights. Install grab bars by the toilet and in the tub and shower. Do not use towel bars as grab bars. Use non-skid mats or decals in the tub or shower. If you need to sit down  in the shower, use a plastic, non-slip stool. Keep the floor dry. Clean up any water that spills on the floor as soon as it happens. Remove soap buildup in the tub or shower regularly. Attach bath mats securely with double-sided non-slip rug tape. Do not have throw rugs and other things on the floor that can make you trip. What can I do in the bedroom? Use night lights. Make sure that you have a light by your bed that is easy to reach. Do not use any sheets or blankets that are too big for your bed. They should not hang down onto the floor. Have a firm chair that has side arms. You can use this for support while you get dressed. Do not have throw rugs and other things on the floor that can make you trip. What can I do in the kitchen? Clean up any spills right away. Avoid walking on wet floors. Keep items that you use a lot in easy-to-reach places. If you need to reach something above you, use a strong step stool that has a grab bar. Keep electrical cords out of the way. Do not use floor polish or  wax that makes floors slippery. If you must use wax, use non-skid floor wax. Do not have throw rugs and other things on the floor that can make you trip. What can I do with my stairs? Do not leave any items on the stairs. Make sure that there are handrails on both sides of the stairs and use them. Fix handrails that are broken or loose. Make sure that handrails are as long as the stairways. Check any carpeting to make sure that it is firmly attached to the stairs. Fix any carpet that is loose or worn. Avoid having throw rugs at the top or bottom of the stairs. If you do have throw rugs, attach them to the floor with carpet tape. Make sure that you have a light switch at the top of the stairs and the bottom of the stairs. If you do not have them, ask someone to add them for you. What else can I do to help prevent falls? Wear shoes that: Do not have high heels. Have rubber bottoms. Are comfortable  and fit you well. Are closed at the toe. Do not wear sandals. If you use a stepladder: Make sure that it is fully opened. Do not climb a closed stepladder. Make sure that both sides of the stepladder are locked into place. Ask someone to hold it for you, if possible. Clearly mark and make sure that you can see: Any grab bars or handrails. First and last steps. Where the edge of each step is. Use tools that help you move around (mobility aids) if they are needed. These include: Canes. Walkers. Scooters. Crutches. Turn on the lights when you go into a dark area. Replace any light bulbs as soon as they burn out. Set up your furniture so you have a clear path. Avoid moving your furniture around. If any of your floors are uneven, fix them. If there are any pets around you, be aware of where they are. Review your medicines with your doctor. Some medicines can make you feel dizzy. This can increase your chance of falling. Ask your doctor what other things that you can do to help prevent falls. This information is not intended to replace advice given to you by your health care provider. Make sure you discuss any questions you have with your health care provider. Document Released: 07/06/2009 Document Revised: 02/15/2016 Document Reviewed: 10/14/2014 Elsevier Interactive Patient Education  2017 [employer].

## 2021-07-17 LAB — HM HEPATITIS C SCREENING LAB: HM Hepatitis Screen: NEGATIVE

## 2021-07-23 DIAGNOSIS — L638 Other alopecia areata: Secondary | ICD-10-CM | POA: Diagnosis not present

## 2021-08-27 DIAGNOSIS — N185 Chronic kidney disease, stage 5: Secondary | ICD-10-CM | POA: Diagnosis not present

## 2021-08-27 DIAGNOSIS — I120 Hypertensive chronic kidney disease with stage 5 chronic kidney disease or end stage renal disease: Secondary | ICD-10-CM | POA: Diagnosis not present

## 2021-12-13 ENCOUNTER — Ambulatory Visit (INDEPENDENT_AMBULATORY_CARE_PROVIDER_SITE_OTHER): Payer: Medicare Other | Admitting: Internal Medicine

## 2021-12-13 ENCOUNTER — Other Ambulatory Visit: Payer: Self-pay

## 2021-12-13 ENCOUNTER — Encounter: Payer: Self-pay | Admitting: Internal Medicine

## 2021-12-13 VITALS — BP 128/78 | HR 64 | Temp 98.4°F | Ht 67.0 in | Wt 215.0 lb

## 2021-12-13 DIAGNOSIS — F319 Bipolar disorder, unspecified: Secondary | ICD-10-CM

## 2021-12-13 DIAGNOSIS — F32A Depression, unspecified: Secondary | ICD-10-CM | POA: Diagnosis not present

## 2021-12-13 DIAGNOSIS — R7989 Other specified abnormal findings of blood chemistry: Secondary | ICD-10-CM | POA: Diagnosis not present

## 2021-12-13 DIAGNOSIS — E7800 Pure hypercholesterolemia, unspecified: Secondary | ICD-10-CM

## 2021-12-13 DIAGNOSIS — D472 Monoclonal gammopathy: Secondary | ICD-10-CM | POA: Diagnosis not present

## 2021-12-13 DIAGNOSIS — I10 Essential (primary) hypertension: Secondary | ICD-10-CM

## 2021-12-13 DIAGNOSIS — Z1211 Encounter for screening for malignant neoplasm of colon: Secondary | ICD-10-CM

## 2021-12-13 DIAGNOSIS — N185 Chronic kidney disease, stage 5: Secondary | ICD-10-CM

## 2021-12-13 DIAGNOSIS — M3320 Polymyositis, organ involvement unspecified: Secondary | ICD-10-CM | POA: Diagnosis not present

## 2021-12-13 DIAGNOSIS — D649 Anemia, unspecified: Secondary | ICD-10-CM | POA: Diagnosis not present

## 2021-12-13 DIAGNOSIS — Z0000 Encounter for general adult medical examination without abnormal findings: Secondary | ICD-10-CM

## 2021-12-13 NOTE — Progress Notes (Signed)
Patient ID: Donald Small, male   DOB: Jul 09, 1961, 61 y.o.   MRN: [id number] ? ? ?Subjective:  ? ? Patient ID: Donald Small, male    DOB: September 06, 1961, 61 y.o.   MRN: [id number] ? ?This visit occurred during the SARS-CoV-2 public health emergency.  Safety protocols were in place, including screening questions prior to the visit, additional usage of staff PPE, and extensive cleaning of exam room while observing appropriate contact time as indicated for disinfecting solutions.  ? ?Patient here for a scheduled follow up.  ? ?HPI ?Here to follow up regarding his blood pressure, cholesterol and kidney function.  He is seeing nephrology.  Has a fistula now.  Being followed by vascular.  Hematoma - planning f/u ultrasound next week.  Has not started dialysis.  Is on transplant list.  Brother wants to donate a kidney.  No chest pain.  Is exercising and going to the gym and lifting.  No sob.  No acid reflux or abdominal pain reported.  Bowels moving.  Handling stress.  Blood pressure doing well.  ? ? ?Past Medical History:  ?Diagnosis Date  ? Arthritis   ? Benign prostatic hypertrophy   ? 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 Hypertension   ? ?Past Surgical History:  ?Procedure Laterality Date  ? COLONOSCOPY WITH PROPOFOL N/A 06/05/2021  ? Procedure: COLONOSCOPY WITH PROPOFOL;  Surgeon: Lucilla Lame, MD;  Location: [facility] ENDOSCOPY;  Service: Endoscopy;  Laterality: N/A;  ? CYST EXCISION Left 11/06/13  ? ganglion cyst removed left hand  ? ?Family History  ?Problem Relation Age of Onset  ? Hypertension Mother   ? Arthritis Father   ? Hypertension Father   ? Cancer Father   ?     prostate cancer  ? Stroke Father   ? Diabetes Brother   ? ?Social History  ? ?Socioeconomic History  ? Marital status: Married  ?  Spouse name: Not on file  ? Number of children: Not on file  ? Years of education: Not on file  ? Highest education level: Not on file  ?Occupational History  ? Not on file  ?Tobacco Use  ? Smoking status:  Former  ?  Packs/day: 2.00  ?  Years: 15.00  ?  Pack years: 30.00  ?  Types: Cigarettes  ?  Quit date: 09/23/1988  ?  Years since quitting: 33.2  ? Smokeless tobacco: Never  ?Vaping Use  ? Vaping Use: Never used  ?Substance and Sexual Activity  ? Alcohol use: No  ?  Alcohol/week: 0.0 standard drinks  ? Drug use: No  ? Sexual activity: Yes  ?Other Topics Concern  ? Not on file  ?Social History Narrative  ? Not on file  ? ?Social Determinants of Health  ? ?Financial Resource Strain: Low Risk   ? Difficulty of Paying Living Expenses: Not hard at all  ?Food Insecurity: No Food Insecurity  ? Worried About Charity fundraiser in the Last Year: Never true  ? Ran Out of Food in the Last Year: Never true  ?Transportation Needs: No Transportation Needs  ? Lack of Transportation (Medical): No  ? Lack of Transportation (Non-Medical): No  ?Physical Activity: Not on file  ?Stress: No Stress Concern Present  ? Feeling of Stress : Not at all  ?Social Connections: Unknown  ? Frequency of Communication with Friends and Family: More than three times a week  ? Frequency of Social Gatherings with Friends and Family: More than three times a  week  ? Attends Religious Services: Not on file  ? Active Member of Clubs or Organizations: Not on file  ? Attends Archivist Meetings: Not on file  ? Marital Status: Not on file  ? ? ? ?Review of Systems  ?Constitutional:  Negative for appetite change and unexpected weight change.  ?HENT:  Negative for congestion and sinus pressure.   ?Respiratory:  Negative for cough, chest tightness and shortness of breath.   ?Cardiovascular:  Negative for chest pain, palpitations and leg swelling.  ?Gastrointestinal:  Negative for abdominal pain, diarrhea, nausea and vomiting.  ?Genitourinary:  Negative for difficulty urinating and dysuria.  ?Musculoskeletal:  Negative for joint swelling and myalgias.  ?Skin:  Negative for color change and rash.  ?Neurological:  Negative for dizziness, light-headedness and  headaches.  ?Psychiatric/Behavioral:  Negative for agitation and dysphoric mood.   ? ?   ?Objective:  ?  ? ?BP 128/78 (BP Location: Right Arm, Patient Position: Sitting, Cuff Size: Large)   Pulse 64   Temp 98.4 ?F (36.9 ?C) (Oral)   Ht '5\' 7"'$  (1.702 m)   Wt 215 lb (97.5 kg)   SpO2 99%   BMI 33.67 kg/m?  ?Wt Readings from Last 3 Encounters:  ?12/13/21 215 lb (97.5 kg)  ?07/12/21 202 lb (91.6 kg)  ?06/14/21 202 lb 9.6 oz (91.9 kg)  ? ? ?Physical Exam ?Constitutional:   ?   General: He is not in acute distress. ?   Appearance: Normal appearance. He is well-developed.  ?HENT:  ?   Head: Normocephalic and atraumatic.  ?   Right Ear: External ear normal.  ?   Left Ear: External ear normal.  ?Eyes:  ?   General: No scleral icterus.    ?   Right eye: No discharge.     ?   Left eye: No discharge.  ?Cardiovascular:  ?   Rate and Rhythm: Normal rate and regular rhythm.  ?Pulmonary:  ?   Effort: Pulmonary effort is normal. No respiratory distress.  ?   Breath sounds: Normal breath sounds.  ?Abdominal:  ?   General: Bowel sounds are normal.  ?   Palpations: Abdomen is soft.  ?   Tenderness: There is no abdominal tenderness.  ?Musculoskeletal:     ?   General: No swelling or tenderness.  ?   Cervical back: Neck supple. No tenderness.  ?Lymphadenopathy:  ?   Cervical: No cervical adenopathy.  ?Skin: ?   Findings: No erythema or rash.  ?   Comments: Fistula - left arm.    ?Neurological:  ?   Mental Status: He is alert.  ?Psychiatric:     ?   Mood and Affect: Mood normal.     ?   Behavior: Behavior normal.  ? ? ? ?Outpatient Encounter Medications as of 12/13/2021  ?Medication Sig  ? amLODipine (NORVASC) 5 MG tablet Take 10 mg by mouth in the morning and at bedtime.  ? Calcium Citrate-Vitamin D (CALCIUM + D PO) Take by mouth.  ? carvedilol (COREG) 12.5 MG tablet Take 12.5 mg by mouth 2 (two) times daily with a meal.  ? FOLIC ACID PO Take by mouth.  ? hydrALAZINE (APRESOLINE) 100 MG tablet Take 1 tablet by mouth 2 (two) times  daily.  ? Omega-3 Fatty Acids (FISH OIL PO) Take 1 capsule by mouth 2 (two) times daily.   ? tamsulosin (FLOMAX) 0.4 MG CAPS capsule Take 0.8 mg by mouth daily.   ? aMILoride (MIDAMOR) 5 MG tablet  Take 10 mg by mouth daily.  (Patient not taking: Reported on 06/14/2021)  ? ?No facility-administered encounter medications on file as of 12/13/2021.  ?  ? ?Lab Results  ?Component Value Date  ? WBC 5.9 06/19/2021  ? HGB 10.9 (A) 06/19/2021  ? HCT 31 (A) 06/19/2021  ? PLT 186 06/19/2021  ? GLUCOSE 88 04/19/2015  ? CHOL 118 06/19/2021  ? TRIG 63 06/19/2021  ? HDL 54 06/19/2021  ? [place] 51 06/19/2021  ? ALT 41 (A) 06/19/2021  ? AST 13 (A) 06/19/2021  ? NA 140 06/19/2021  ? K 4.0 06/19/2021  ? CL 108 06/19/2021  ? CREATININE 6.9 (A) 06/19/2021  ? BUN 80 (A) 06/19/2021  ? CO2 25 (A) 06/19/2021  ? TSH 1.54 06/19/2021  ? HGBA1C 5.7 06/19/2021  ? ? ?   ?Assessment & Plan:  ? ?Problem List Items Addressed This Visit   ? ? Abnormal liver function tests  ?  Previously worked up at the [place]. Felt to be related to lithium and hctz.  Off hctz and lithium.  Last liver check improved.  ?  ?  ? Anemia  ?  Followed by hematology. Baseline hgb 10.  ?  ?  ? Bipolar disorder, unspecified ([place])  ?  Followed by psychiatry.  Off lithium.   ?  ?  ? CKD (chronic kidney disease) stage 5, GFR less than 15 ml/min (HCC)  ?  Being followed by nephrology.  On transplant list.  Has fistula.  Has not started dialysis.  Nephrology following labs.  Obtain results.  ?  ?  ? Colon cancer screening  ?  Colonoscopy 05/2021.  ?  ?  ? Essential hypertension, benign - Primary  ?  Blood pressure improved.  Continue hydralazine, coreg and amlodipine.  Follow pressures.  Follow metabolic panel.  Blood pressure at home 112/70, 12076 ?  ?  ? Hypercholesterolemia  ?  Low cholesterol diet and exercise.  Follow lipid panel.   ?  ?  ? MGUS (monoclonal gammopathy of unknown significance)  ?  Followed by hematology.  ?  ?  ? Mild depression  ?  Doing well.  Followed by  psychiatry.  ?  ?  ? Polymyositis ([place])  ?  Off methotrexate.  Overall stable.  Follow.   ?  ?  ? ? ? ?Einar Pheasant, MD  ?

## 2021-12-16 ENCOUNTER — Encounter: Payer: Self-pay | Admitting: Internal Medicine

## 2021-12-16 NOTE — Assessment & Plan Note (Signed)
Colonoscopy 05/2021.  

## 2021-12-16 NOTE — Assessment & Plan Note (Signed)
Low cholesterol diet and exercise.  Follow lipid panel.   

## 2021-12-16 NOTE — Assessment & Plan Note (Signed)
Off methotrexate.  Overall stable.  Follow.   ?

## 2021-12-16 NOTE — Assessment & Plan Note (Signed)
Followed by psychiatry.  Off lithium.   

## 2021-12-16 NOTE — Assessment & Plan Note (Signed)
Doing well.  Followed by psychiatry.  ?

## 2021-12-16 NOTE — Assessment & Plan Note (Signed)
Being followed by nephrology.  On transplant list.  Has fistula.  Has not started dialysis.  Nephrology following labs.  Obtain results.  ?

## 2021-12-16 NOTE — Assessment & Plan Note (Signed)
Blood pressure improved.  Continue hydralazine, coreg and amlodipine.  Follow pressures.  Follow metabolic panel.  Blood pressure at home 112/70, 12076 ?

## 2021-12-16 NOTE — Assessment & Plan Note (Signed)
Followed by hematology 

## 2021-12-16 NOTE — Assessment & Plan Note (Addendum)
Followed by hematology. Baseline hgb 10.  ?

## 2021-12-16 NOTE — Assessment & Plan Note (Addendum)
Previously worked up at the [place]. Felt to be related to lithium and hctz.  Off hctz and lithium.  Last liver check improved.  ?

## 2022-01-01 DIAGNOSIS — I10 Essential (primary) hypertension: Secondary | ICD-10-CM | POA: Diagnosis not present

## 2022-01-01 DIAGNOSIS — N185 Chronic kidney disease, stage 5: Secondary | ICD-10-CM | POA: Diagnosis not present

## 2022-01-01 DIAGNOSIS — N2581 Secondary hyperparathyroidism of renal origin: Secondary | ICD-10-CM | POA: Diagnosis not present

## 2022-01-01 DIAGNOSIS — D631 Anemia in chronic kidney disease: Secondary | ICD-10-CM | POA: Diagnosis not present

## 2022-02-13 DIAGNOSIS — I10 Essential (primary) hypertension: Secondary | ICD-10-CM | POA: Diagnosis not present

## 2022-02-13 DIAGNOSIS — N185 Chronic kidney disease, stage 5: Secondary | ICD-10-CM | POA: Diagnosis not present

## 2022-02-19 DIAGNOSIS — N185 Chronic kidney disease, stage 5: Secondary | ICD-10-CM | POA: Diagnosis not present

## 2022-02-19 DIAGNOSIS — D649 Anemia, unspecified: Secondary | ICD-10-CM | POA: Diagnosis not present

## 2022-02-19 DIAGNOSIS — I120 Hypertensive chronic kidney disease with stage 5 chronic kidney disease or end stage renal disease: Secondary | ICD-10-CM | POA: Diagnosis not present

## 2022-02-28 ENCOUNTER — Other Ambulatory Visit (INDEPENDENT_AMBULATORY_CARE_PROVIDER_SITE_OTHER): Payer: Self-pay | Admitting: Vascular Surgery

## 2022-02-28 DIAGNOSIS — N186 End stage renal disease: Secondary | ICD-10-CM

## 2022-03-12 ENCOUNTER — Ambulatory Visit (INDEPENDENT_AMBULATORY_CARE_PROVIDER_SITE_OTHER): Payer: No Typology Code available for payment source | Admitting: Vascular Surgery

## 2022-03-12 ENCOUNTER — Encounter (INDEPENDENT_AMBULATORY_CARE_PROVIDER_SITE_OTHER): Payer: Self-pay | Admitting: Vascular Surgery

## 2022-03-12 ENCOUNTER — Ambulatory Visit (INDEPENDENT_AMBULATORY_CARE_PROVIDER_SITE_OTHER): Payer: No Typology Code available for payment source

## 2022-03-12 VITALS — BP 139/79 | HR 68 | Resp 16 | Ht 67.0 in | Wt 209.0 lb

## 2022-03-12 DIAGNOSIS — T829XXA Unspecified complication of cardiac and vascular prosthetic device, implant and graft, initial encounter: Secondary | ICD-10-CM

## 2022-03-12 DIAGNOSIS — I10 Essential (primary) hypertension: Secondary | ICD-10-CM | POA: Diagnosis not present

## 2022-03-12 DIAGNOSIS — E7800 Pure hypercholesterolemia, unspecified: Secondary | ICD-10-CM

## 2022-03-12 DIAGNOSIS — N186 End stage renal disease: Secondary | ICD-10-CM

## 2022-03-12 DIAGNOSIS — N185 Chronic kidney disease, stage 5: Secondary | ICD-10-CM | POA: Diagnosis not present

## 2022-03-12 NOTE — Assessment & Plan Note (Signed)
The patient has CKD stage V and is nearing dialysis dependence.  Essentially, at this point they are waiting for his fistula to be usable. We performed a duplex today to evaluate his fistula to determine how close it was to mature.  There was not a stenosis in the AV fistula was widely patent, but there was a sizable competing branch in the mid to distal upper arm which was limiting maturation and flow of the AV fistula beyond the branch. To help make his fistula functional, I would recommend either surgical ligation of the AV fistula branches versus fistulogram with limited contrast and embolization of the branches.  I discussed both procedures and the differences between the 2 procedures with the patient today.  He would prefer the less invasive fistulogram with coil embolization which is reasonable.  This will be done in the near future at his convenience.

## 2022-03-12 NOTE — Progress Notes (Signed)
Patient ID: Donald Small, male   DOB: Mar 12, 1961, 61 y.o.   MRN: [id number]  Chief Complaint  Patient presents with   New Patient (Initial Visit)    Ref Radene Knee consult AVF in place,eval/fistulagram to determine if ready    HPI Donald Small is a 61 y.o. male.  I am asked to see the patient by Lynnea Ferrier for evaluation of his left brachiocephalic AV fistula.  This was created earlier this year and he has not yet started dialysis.  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  They are pretty much waiting to start dialysis until his fistula is usable.  He had a fistulogram couple of months ago at the [place] but it does not sound like any intervention was done.  We performed a duplex today to evaluate his fistula to determine how close it was to mature.  There was not a stenosis in the AV fistula was widely patent, but there was a sizable competing branch in the mid to distal upper arm which was limiting maturation and flow of the AV fistula beyond the branch.   Past Medical History:  Diagnosis Date   Arthritis    Benign prostatic hypertrophy    Chronic kidney disease    Depression    With psychotic features   Hypertension     Past Surgical History:  Procedure Laterality Date   COLONOSCOPY WITH PROPOFOL N/A 06/05/2021   Procedure: COLONOSCOPY WITH PROPOFOL;  Surgeon: Lucilla Lame, MD;  Location: [facility] ENDOSCOPY;  Service: Endoscopy;  Laterality: N/A;   CYST EXCISION Left 11/06/13   ganglion cyst removed left hand  Left brachiocephalic AV fistula   Family History  Problem Relation Age of Onset   Hypertension Mother    Arthritis Father    Hypertension Father    Cancer Father        prostate cancer   Stroke Father    Diabetes Brother       Social History   Tobacco Use   Smoking status: Former    Packs/day: 2.00    Years: 15.00    Total pack years: 30.00    Types: Cigarettes    Quit date: 09/23/1988     Years since quitting: 33.4   Smokeless tobacco: Never  Vaping Use   Vaping Use: Never used  Substance Use Topics   Alcohol use: No    Alcohol/week: 0.0 standard drinks of alcohol   Drug use: No     No Known Allergies  Current Outpatient Medications  Medication Sig Dispense Refill   amLODipine (NORVASC) 5 MG tablet Take 10 mg by mouth in the morning and at bedtime.     ascorbic acid (VITAMIN C) 1000 MG tablet Take by mouth.     Calcium Citrate-Vitamin D (CALCIUM + D PO) Take by mouth.     Calcium Polycarbophil (FIBER) 625 MG TABS Take by mouth.     carvedilol (COREG) 12.5 MG tablet Take 12.5 mg by mouth 2 (two) times daily with a meal.     FINASTERIDE PO Take by mouth.     furosemide (LASIX) 40 MG tablet Take 40 mg by mouth daily.     hydrALAZINE (APRESOLINE) 100 MG tablet Take 1 tablet by mouth 2 (two) times daily.     Omega-3 Fatty Acids (FISH OIL PO) Take 1 capsule by mouth 2 (two) times daily.      pravastatin (PRAVACHOL) 40 MG tablet  TAKE ONE TABLET BY MOUTH AT BEDTIME FOR CHOLESTEROL TO REPLACE ATORVASTATIN     sevelamer carbonate (RENVELA) 800 MG tablet Take by mouth.     tamsulosin (FLOMAX) 0.4 MG CAPS capsule Take 0.8 mg by mouth daily.      aMILoride (MIDAMOR) 5 MG tablet Take 10 mg by mouth daily.  (Patient not taking: Reported on 1/61/0960)     FOLIC ACID PO Take by mouth. (Patient not taking: Reported on 03/12/2022)     No current facility-administered medications for this visit.      REVIEW OF SYSTEMS (Negative unless checked)  Constitutional: '[]'$ Weight loss  '[]'$ Fever  '[]'$ Chills Cardiac: '[]'$ Chest pain   '[]'$ Chest pressure   '[]'$ Palpitations   '[]'$ Shortness of breath when laying flat   '[]'$ Shortness of breath at rest   '[x]'$ Shortness of breath with exertion. Vascular:  '[]'$ Pain in legs with walking   '[]'$ Pain in legs at rest   '[]'$ Pain in legs when laying flat   '[]'$ Claudication   '[]'$ Pain in feet when walking  '[]'$ Pain in feet at rest  '[]'$ Pain in feet when laying flat   '[]'$ History of DVT    '[]'$ Phlebitis   '[]'$ Swelling in legs   '[]'$ Varicose veins   '[]'$ Non-healing ulcers Pulmonary:   '[]'$ Uses home oxygen   '[]'$ Productive cough   '[]'$ Hemoptysis   '[]'$ Wheeze  '[]'$ COPD   '[]'$ Asthma Neurologic:  '[]'$ Dizziness  '[]'$ Blackouts   '[]'$ Seizures   '[]'$ History of stroke   '[]'$ History of TIA  '[]'$ Aphasia   '[]'$ Temporary blindness   '[]'$ Dysphagia   '[]'$ Weakness or numbness in arms   '[]'$ Weakness or numbness in legs Musculoskeletal:  '[x]'$ Arthritis   '[]'$ Joint swelling   '[]'$ Joint pain   '[]'$ Low back pain Hematologic:  '[]'$ Easy bruising  '[]'$ Easy bleeding   '[]'$ Hypercoagulable state   '[x]'$ Anemic  '[]'$ Hepatitis Gastrointestinal:  '[]'$ Blood in stool   '[]'$ Vomiting blood  '[]'$ Gastroesophageal reflux/heartburn   '[]'$ Abdominal pain Genitourinary:  '[x]'$ Chronic kidney disease   '[]'$ Difficult urination  '[x]'$ Frequent urination  '[]'$ Burning with urination   '[]'$ Hematuria Skin:  '[]'$ Rashes   '[]'$ Ulcers   '[]'$ Wounds Psychological:  '[]'$ History of anxiety   '[x]'$  History of major depression.    Physical Exam BP 139/79 (BP Location: Right Arm)   Pulse 68   Resp 16   Ht '5\' 7"'$  (1.702 m)   Wt 209 lb (94.8 kg)   BMI 32.73 kg/m  Gen:  WD/WN, NAD Head: [place]/AT, No temporalis wasting.  Ear/Nose/Throat: Hearing grossly intact, nares w/o erythema or drainage, oropharynx w/o Erythema/Exudate Eyes: Conjunctiva clear, sclera non-icteric  Neck: trachea midline.  No JVD.  Pulmonary:  Good air movement, respirations not labored, no use of accessory muscles  Cardiac: RRR, no JVD Vascular: Left brachiocephalic AV fistula with a good thrill although the thrill does diminish in the upper arm beyond the palpable cephalic vein branch seen on duplex. Vessel Right Left  Radial Palpable Palpable                                   Gastrointestinal:. No masses, surgical incisions, or scars. Musculoskeletal: M/S 5/5 throughout.  Extremities without ischemic changes.  No deformity or atrophy.  Mild lower extremity edema. Neurologic: Sensation grossly intact in extremities.  Symmetrical.  Speech is  fluent. Motor exam as listed above. Psychiatric: Judgment intact, Mood & affect appropriate for pt's clinical situation. Dermatologic: No rashes or ulcers noted.  No cellulitis or open wounds.    Radiology No results found.  Labs No results found for this  or any previous visit (from the past 2160 hour(s)).  Assessment/Plan:  CKD (chronic kidney disease) stage 5, GFR less than 15 ml/min (HCC) The patient has CKD stage V and is nearing dialysis dependence.  Essentially, at this point they are waiting for his fistula to be usable. We performed a duplex today to evaluate his fistula to determine how close it was to mature.  There was not a stenosis in the AV fistula was widely patent, but there was a sizable competing branch in the mid to distal upper arm which was limiting maturation and flow of the AV fistula beyond the branch. To help make his fistula functional, I would recommend either surgical ligation of the AV fistula branches versus fistulogram with limited contrast and embolization of the branches.  I discussed both procedures and the differences between the 2 procedures with the patient today.  He would prefer the less invasive fistulogram with coil embolization which is reasonable.  This will be done in the near future at his convenience.  Essential hypertension, benign An underlying cause of his renal failure and blood pressure control important in reducing the progression of atherosclerotic disease. On appropriate oral medications.   Hypercholesterolemia lipid control important in reducing the progression of atherosclerotic disease. Continue statin therapy   Complication of AV dialysis fistula, initial encounter The patient has CKD stage V and is nearing dialysis dependence.  Essentially, at this point they are waiting for his fistula to be usable. We performed a duplex today to evaluate his fistula to determine how close it was to mature.  There was not a stenosis in the AV  fistula was widely patent, but there was a sizable competing branch in the mid to distal upper arm which was limiting maturation and flow of the AV fistula beyond the branch. To help make his fistula functional, I would recommend either surgical ligation of the AV fistula branches versus fistulogram with limited contrast and embolization of the branches.  I discussed both procedures and the differences between the 2 procedures with the patient today.  He would prefer the less invasive fistulogram with coil embolization which is reasonable.  This will be done in the near future at his convenience.      Leotis Pain 03/12/2022, 4:37 PM   This note was created with Dragon medical transcription system.  Any errors from dictation are unintentional.

## 2022-03-12 NOTE — Assessment & Plan Note (Signed)
An underlying cause of his renal failure and blood pressure control important in reducing the progression of atherosclerotic disease. On appropriate oral medications.

## 2022-03-12 NOTE — Assessment & Plan Note (Signed)
lipid control important in reducing the progression of atherosclerotic disease. Continue statin therapy  

## 2022-03-13 ENCOUNTER — Telehealth (INDEPENDENT_AMBULATORY_CARE_PROVIDER_SITE_OTHER): Payer: Self-pay

## 2022-03-13 NOTE — Telephone Encounter (Signed)
Spoke with the patient and he is scheduled with Dr. Lucky Cowboy for a left arm fistulagram with coils on 03/18/22 with a 11:15 am arrival time to the [facility]. Pre-procedure instructions were discussed and will be mailed.

## 2022-03-18 ENCOUNTER — Ambulatory Visit
Admission: RE | Admit: 2022-03-18 | Discharge: 2022-03-18 | Disposition: A | Discharge status: Home / Self Care | Payer: No Typology Code available for payment source | Source: Ambulatory Visit | Attending: Vascular Surgery | Admitting: Vascular Surgery

## 2022-03-18 ENCOUNTER — Encounter: Payer: Self-pay | Admitting: Vascular Surgery

## 2022-03-18 ENCOUNTER — Encounter
Admission: RE | Disposition: A | Discharge status: Home / Self Care | Payer: Self-pay | Source: Ambulatory Visit | Attending: Vascular Surgery

## 2022-03-18 ENCOUNTER — Other Ambulatory Visit: Payer: Self-pay

## 2022-03-18 DIAGNOSIS — T82898A Other specified complication of vascular prosthetic devices, implants and grafts, initial encounter: Secondary | ICD-10-CM | POA: Insufficient documentation

## 2022-03-18 DIAGNOSIS — E7800 Pure hypercholesterolemia, unspecified: Secondary | ICD-10-CM | POA: Diagnosis not present

## 2022-03-18 DIAGNOSIS — N185 Chronic kidney disease, stage 5: Secondary | ICD-10-CM | POA: Insufficient documentation

## 2022-03-18 DIAGNOSIS — I120 Hypertensive chronic kidney disease with stage 5 chronic kidney disease or end stage renal disease: Secondary | ICD-10-CM | POA: Diagnosis not present

## 2022-03-18 DIAGNOSIS — Z992 Dependence on renal dialysis: Secondary | ICD-10-CM

## 2022-03-18 DIAGNOSIS — Y832 Surgical operation with anastomosis, bypass or graft as the cause of abnormal reaction of the patient, or of later complication, without mention of misadventure at the time of the procedure: Secondary | ICD-10-CM | POA: Diagnosis not present

## 2022-03-18 DIAGNOSIS — T82858A Stenosis of vascular prosthetic devices, implants and grafts, initial encounter: Secondary | ICD-10-CM

## 2022-03-18 DIAGNOSIS — Z87891 Personal history of nicotine dependence: Secondary | ICD-10-CM | POA: Insufficient documentation

## 2022-03-18 DIAGNOSIS — T82590A Other mechanical complication of surgically created arteriovenous fistula, initial encounter: Secondary | ICD-10-CM | POA: Diagnosis not present

## 2022-03-18 DIAGNOSIS — N186 End stage renal disease: Secondary | ICD-10-CM | POA: Diagnosis not present

## 2022-03-18 HISTORY — PX: A/V FISTULAGRAM: CATH118298

## 2022-03-18 LAB — POTASSIUM (ARMC VASCULAR LAB ONLY): Potassium (ARMC vascular lab): 4.3 mmol/L (ref 3.5–5.1)

## 2022-03-18 SURGERY — A/V FISTULAGRAM
Anesthesia: Moderate Sedation | Laterality: Left

## 2022-03-18 MED ORDER — IODIXANOL 320 MG/ML IV SOLN
INTRAVENOUS | Status: DC | PRN
  Administered 2022-03-18: 25 mL

## 2022-03-18 MED ORDER — HYDROMORPHONE HCL 1 MG/ML IJ SOLN: 1.0000 mg | Freq: Once | INTRAMUSCULAR | Status: DC | PRN

## 2022-03-18 MED ORDER — FENTANYL CITRATE (PF) 100 MCG/2ML IJ SOLN
INTRAMUSCULAR | Status: DC | PRN
  Administered 2022-03-18: 50 ug via INTRAVENOUS
  Administered 2022-03-18: 25 ug via INTRAVENOUS

## 2022-03-18 MED ORDER — SODIUM CHLORIDE 0.9 % IV SOLN: INTRAVENOUS | Status: DC

## 2022-03-18 MED ORDER — METHYLPREDNISOLONE SODIUM SUCC 125 MG IJ SOLR: 125.0000 mg | Freq: Once | INTRAMUSCULAR | Status: DC | PRN

## 2022-03-18 MED ORDER — HEPARIN SODIUM (PORCINE) 1000 UNIT/ML IJ SOLN
INTRAMUSCULAR | Status: AC
  Filled 2022-03-18: qty 10

## 2022-03-18 MED ORDER — FENTANYL CITRATE (PF) 100 MCG/2ML IJ SOLN
INTRAMUSCULAR | Status: AC
  Filled 2022-03-18: qty 2

## 2022-03-18 MED ORDER — HEPARIN SODIUM (PORCINE) 1000 UNIT/ML IJ SOLN
INTRAMUSCULAR | Status: DC | PRN
  Administered 2022-03-18: 3000 [IU] via INTRAVENOUS

## 2022-03-18 MED ORDER — DIPHENHYDRAMINE HCL 50 MG/ML IJ SOLN: 50.0000 mg | Freq: Once | INTRAMUSCULAR | Status: DC | PRN

## 2022-03-18 MED ORDER — MIDAZOLAM HCL 2 MG/ML PO SYRP
8.0000 mg | ORAL_SOLUTION | Freq: Once | ORAL | Status: DC | PRN
Start: 2022-03-18 — End: 2022-03-18

## 2022-03-18 MED ORDER — MIDAZOLAM HCL 2 MG/2ML IJ SOLN
INTRAMUSCULAR | Status: DC | PRN
  Administered 2022-03-18: 1 mg via INTRAVENOUS
  Administered 2022-03-18: 2 mg via INTRAVENOUS

## 2022-03-18 MED ORDER — FAMOTIDINE 20 MG PO TABS
40.0000 mg | ORAL_TABLET | Freq: Once | ORAL | Status: DC | PRN
Start: 2022-03-18 — End: 2022-03-18

## 2022-03-18 MED ORDER — CEFAZOLIN SODIUM-DEXTROSE 1-4 GM/50ML-% IV SOLN
INTRAVENOUS | Status: AC
  Administered 2022-03-18: 1 g via INTRAVENOUS
  Filled 2022-03-18: qty 50

## 2022-03-18 MED ORDER — ONDANSETRON HCL 4 MG/2ML IJ SOLN
4.0000 mg | Freq: Four times a day (QID) | INTRAMUSCULAR | Status: DC | PRN
Start: 2022-03-18 — End: 2022-03-18

## 2022-03-18 MED ORDER — CEFAZOLIN SODIUM-DEXTROSE 1-4 GM/50ML-% IV SOLN: 1.0000 g | INTRAVENOUS | Status: AC

## 2022-03-18 MED ORDER — MIDAZOLAM HCL 2 MG/2ML IJ SOLN
INTRAMUSCULAR | Status: AC
  Filled 2022-03-18: qty 4

## 2022-03-18 SURGICAL SUPPLY — 19 items
CANNULA 5F STIFF (CANNULA) ×1 IMPLANT
CATH BEACON 5 .035 40 KMP TP (CATHETERS) IMPLANT
CATH BEACON 5 .038 40 KMP TP (CATHETERS) ×2
CATH MICROCATH PRGRT 2.8F 110 (CATHETERS) IMPLANT
COIL 400 COMPLEX SOFT 6X20CM (Vascular Products) ×1 IMPLANT
COIL 400 COMPLEX SOFT 6X30CM (Vascular Products) ×1 IMPLANT
COIL 400 COMPLEX SOFT 8X35CM (Vascular Products) ×1 IMPLANT
COIL 400 COMPLEX STD 7X25CM (Vascular Products) ×1 IMPLANT
COIL 400 COMPLEX STD 8X25CM (Vascular Products) ×1 IMPLANT
COVER PROBE U/S 5X48 (MISCELLANEOUS) ×1 IMPLANT
DEVICE OCCLUSION PODJ15 (Vascular Products) IMPLANT
DRAPE BRACHIAL (DRAPES) ×1 IMPLANT
GLIDEWIRE ADV .035X180CM (WIRE) ×1 IMPLANT
HANDLE DETACHMENT COIL (MISCELLANEOUS) ×1 IMPLANT
MICROCATH PROGREAT 2.8F 110 CM (CATHETERS) ×2
OCCLUSION DEVICE PODJ15 (Vascular Products) ×2 IMPLANT
PACK ANGIOGRAPHY (CUSTOM PROCEDURE TRAY) ×3 IMPLANT
SHEATH BRITE TIP 6FRX5.5 (SHEATH) ×1 IMPLANT
SUT MNCRL AB 4-0 PS2 18 (SUTURE) ×1 IMPLANT

## 2022-03-27 DIAGNOSIS — N2581 Secondary hyperparathyroidism of renal origin: Secondary | ICD-10-CM | POA: Diagnosis not present

## 2022-03-27 DIAGNOSIS — N185 Chronic kidney disease, stage 5: Secondary | ICD-10-CM | POA: Diagnosis not present

## 2022-03-27 DIAGNOSIS — N186 End stage renal disease: Secondary | ICD-10-CM | POA: Diagnosis not present

## 2022-04-03 DIAGNOSIS — D631 Anemia in chronic kidney disease: Secondary | ICD-10-CM | POA: Diagnosis not present

## 2022-04-03 DIAGNOSIS — N2581 Secondary hyperparathyroidism of renal origin: Secondary | ICD-10-CM | POA: Diagnosis not present

## 2022-04-03 DIAGNOSIS — N179 Acute kidney failure, unspecified: Secondary | ICD-10-CM | POA: Diagnosis not present

## 2022-04-03 DIAGNOSIS — N185 Chronic kidney disease, stage 5: Secondary | ICD-10-CM | POA: Diagnosis not present

## 2022-04-03 DIAGNOSIS — I10 Essential (primary) hypertension: Secondary | ICD-10-CM | POA: Diagnosis not present

## 2022-04-03 DIAGNOSIS — R945 Abnormal results of liver function studies: Secondary | ICD-10-CM | POA: Diagnosis not present

## 2022-05-20 DIAGNOSIS — H6982 Other specified disorders of Eustachian tube, left ear: Secondary | ICD-10-CM | POA: Diagnosis not present

## 2022-05-22 DIAGNOSIS — R945 Abnormal results of liver function studies: Secondary | ICD-10-CM | POA: Diagnosis not present

## 2022-05-22 DIAGNOSIS — N179 Acute kidney failure, unspecified: Secondary | ICD-10-CM | POA: Diagnosis not present

## 2022-05-22 DIAGNOSIS — N2581 Secondary hyperparathyroidism of renal origin: Secondary | ICD-10-CM | POA: Diagnosis not present

## 2022-05-22 DIAGNOSIS — N185 Chronic kidney disease, stage 5: Secondary | ICD-10-CM | POA: Diagnosis not present

## 2022-05-22 DIAGNOSIS — I10 Essential (primary) hypertension: Secondary | ICD-10-CM | POA: Diagnosis not present

## 2022-05-22 DIAGNOSIS — D631 Anemia in chronic kidney disease: Secondary | ICD-10-CM | POA: Diagnosis not present

## 2022-06-06 DIAGNOSIS — N185 Chronic kidney disease, stage 5: Secondary | ICD-10-CM | POA: Diagnosis not present

## 2022-06-06 DIAGNOSIS — Z01818 Encounter for other preprocedural examination: Secondary | ICD-10-CM | POA: Diagnosis not present

## 2022-06-06 DIAGNOSIS — Z7682 Awaiting organ transplant status: Secondary | ICD-10-CM | POA: Diagnosis not present

## 2022-06-06 DIAGNOSIS — I10 Essential (primary) hypertension: Secondary | ICD-10-CM | POA: Diagnosis not present

## 2022-06-12 DIAGNOSIS — N186 End stage renal disease: Secondary | ICD-10-CM | POA: Diagnosis not present

## 2022-06-12 DIAGNOSIS — N185 Chronic kidney disease, stage 5: Secondary | ICD-10-CM | POA: Diagnosis not present

## 2022-06-12 DIAGNOSIS — I10 Essential (primary) hypertension: Secondary | ICD-10-CM | POA: Diagnosis not present

## 2022-06-12 DIAGNOSIS — R945 Abnormal results of liver function studies: Secondary | ICD-10-CM | POA: Diagnosis not present

## 2022-06-12 DIAGNOSIS — N2581 Secondary hyperparathyroidism of renal origin: Secondary | ICD-10-CM | POA: Diagnosis not present

## 2022-06-12 DIAGNOSIS — N179 Acute kidney failure, unspecified: Secondary | ICD-10-CM | POA: Diagnosis not present

## 2022-06-12 DIAGNOSIS — D631 Anemia in chronic kidney disease: Secondary | ICD-10-CM | POA: Diagnosis not present

## 2022-06-18 ENCOUNTER — Ambulatory Visit: Payer: Non-veteran care | Admitting: Internal Medicine

## 2022-07-15 ENCOUNTER — Ambulatory Visit (INDEPENDENT_AMBULATORY_CARE_PROVIDER_SITE_OTHER): Payer: Medicare Other

## 2022-07-15 VITALS — Ht 67.0 in | Wt 208.0 lb

## 2022-07-15 DIAGNOSIS — Z0000 Encounter for general adult medical examination without abnormal findings: Secondary | ICD-10-CM | POA: Diagnosis not present

## 2022-07-15 NOTE — Progress Notes (Signed)
Subjective:   Donald Small is a 61 y.o. male who presents for Medicare Annual/Subsequent preventive examination.  Review of Systems    No ROS.  Medicare Wellness Virtual Visit.  Visual/audio telehealth visit, UTA vital signs.   See social history for additional risk factors.   Cardiac Risk Factors include: advanced age (>63mn, >>3women);male gender     Objective:    Today's Vitals   07/15/22 1443  Weight: 208 lb (94.3 kg)  Height: '5\' 7"'$  (1.702 m)   Body mass index is 32.58 kg/m.     07/15/2022    2:37 PM 03/18/2022   10:44 AM 07/12/2021   10:01 AM 06/05/2021    8:38 AM 02/21/2017    9:10 AM 03/04/2015    3:27 AM 03/03/2015    6:47 PM  Advanced Directives  Does Patient Have a Medical Advance Directive? No No Yes Yes No No No  Type of AScientist, physiologicalof ARosserLiving will HMulfordLiving will     Does patient want to make changes to medical advance directive? No - Patient declined  No - Patient declined      Copy of [place] Chart?   No - copy requested No - copy requested     Would patient like information on creating a medical advance directive?  No - Patient declined   Yes (MAU/Ambulatory/Procedural Areas - Information given) No - patient declined information No - patient declined information    Current Medications (verified) Outpatient Encounter Medications as of 07/15/2022  Medication Sig   aMILoride (MIDAMOR) 5 MG tablet Take 10 mg by mouth daily.  (Patient not taking: Reported on 06/14/2021)   amLODipine (NORVASC) 5 MG tablet Take 10 mg by mouth in the morning and at bedtime.   ascorbic acid (VITAMIN C) 1000 MG tablet Take by mouth.   Calcium Citrate-Vitamin D (CALCIUM + D PO) Take by mouth.   Calcium Polycarbophil (FIBER) 625 MG TABS Take by mouth.   carvedilol (COREG) 12.5 MG tablet Take 12.5 mg by mouth 2 (two) times daily with a meal.   FINASTERIDE PO Take by mouth.   FOLIC ACID PO Take  by mouth. (Patient not taking: Reported on 03/12/2022)   furosemide (LASIX) 40 MG tablet Take 40 mg by mouth daily.   hydrALAZINE (APRESOLINE) 100 MG tablet Take 1 tablet by mouth 2 (two) times daily.   Omega-3 Fatty Acids (FISH OIL PO) Take 1 capsule by mouth 2 (two) times daily.    pravastatin (PRAVACHOL) 40 MG tablet TAKE ONE TABLET BY MOUTH AT BEDTIME FOR CHOLESTEROL TO REPLACE ATORVASTATIN   sevelamer carbonate (RENVELA) 800 MG tablet Take by mouth.   tamsulosin (FLOMAX) 0.4 MG CAPS capsule Take 0.8 mg by mouth daily.    No facility-administered encounter medications on file as of 07/15/2022.    Allergies (verified) Patient has no known allergies.   History: Past Medical History:  Diagnosis Date   Arthritis    Benign prostatic hypertrophy    Chronic kidney disease    Depression    With psychotic features   Hypertension    Past Surgical History:  Procedure Laterality Date   A/V FISTULAGRAM Left 03/18/2022   Procedure: A/V Fistulagram;  Surgeon: DAlgernon Huxley MD;  [facility];  Service: Cardiovascular;  Laterality: Left;   AV FISTULA PLACEMENT Left    COLONOSCOPY WITH PROPOFOL N/A 06/05/2021   Procedure: COLONOSCOPY WITH PROPOFOL;  Surgeon: WLucilla Lame MD;  Location: [facility] ENDOSCOPY;  Service: Endoscopy;  Laterality: N/A;   CYST EXCISION Left 11/06/2013   ganglion cyst removed left hand   CYST EXCISION     Family History  Problem Relation Age of Onset   Hypertension Mother    Arthritis Father    Hypertension Father    Cancer Father        prostate cancer   Stroke Father    Diabetes Brother    Social History   Socioeconomic History   Marital status: Married    Spouse name: Not on file   Number of children: Not on file   Years of education: Not on file   Highest education level: Not on file  Occupational History   Not on file  Tobacco Use   Smoking status: Former    Packs/day: 2.00    Years: 15.00    Total pack years: 30.00    Types:  Cigarettes    Quit date: 09/23/1988    Years since quitting: 33.8   Smokeless tobacco: Never  Vaping Use   Vaping Use: Never used  Substance and Sexual Activity   Alcohol use: No    Alcohol/week: 0.0 standard drinks of alcohol   Drug use: No   Sexual activity: Yes  Other Topics Concern   Not on file  Social History Narrative   Not on file   Social Determinants of Health   Financial Resource Strain: Low Risk  (07/15/2022)   Overall Financial Resource Strain (CARDIA)    Difficulty of Paying Living Expenses: Not hard at all  Food Insecurity: No Food Insecurity (07/15/2022)   Hunger Vital Sign    Worried About Running Out of Food in the Last Year: Never true    Ran Out of Food in the Last Year: Never true  Transportation Needs: No Transportation Needs (07/15/2022)   PRAPARE - Hydrologist (Medical): No    Lack of Transportation (Non-Medical): No  Physical Activity: Insufficiently Active (07/15/2022)   Exercise Vital Sign    Days of Exercise per Week: 3 days    Minutes of Exercise per Session: 30 min  Stress: No Stress Concern Present (07/15/2022)   [place]    Feeling of Stress : Not at all  Social Connections: Unknown (07/15/2022)   Social Connection and Isolation Panel [NHANES]    Frequency of Communication with Friends and Family: More than three times a week    Frequency of Social Gatherings with Friends and Family: More than three times a week    Attends Religious Services: Not on Advertising copywriter or Organizations: Not on file    Attends Archivist Meetings: Not on file    Marital Status: Not on file    Tobacco Counseling Counseling given: Not Answered   Clinical Intake:  Pre-visit preparation completed: Yes        Diabetes: No  How often do you need to have someone help you when you read instructions, pamphlets, or other written materials  from your doctor or pharmacy?: 1 - Never    Interpreter Needed?: No      Activities of Daily Living    07/15/2022    2:34 PM 03/18/2022   10:43 AM  In your present state of health, do you have any difficulty performing the following activities:  Hearing? 0 0  Vision? 0 0  Difficulty concentrating or making decisions? 0 0  Walking  or climbing stairs? 0   Dressing or bathing? 0 0  Doing errands, shopping? 0   Preparing Food and eating ? N   Using the Toilet? N   In the past six months, have you accidently leaked urine? N   Do you have problems with loss of bowel control? N   Managing your Medications? N   Managing your Finances? N   Housekeeping or managing your Housekeeping? N     Patient Care Team: Einar Pheasant, MD as PCP - General (Internal Medicine)  Indicate any recent Medical Services you may have received from other than [facility] providers in the past year (date may be approximate).     Assessment:   This is a routine wellness examination for [place].  I connected with  [name] on 07/15/22 by a audio enabled telemedicine application and verified that I am speaking with the correct person using two identifiers.  Patient Location: Home  Provider Location: Office/Clinic  I discussed the limitations of evaluation and management by telemedicine. The patient expressed understanding and agreed to proceed.   Hearing/Vision screen Hearing Screening - Comments:: Patient is able to hear conversational tones without difficulty.  No issues reported.  Vision Screening - Comments:: Followed by [facility]) Wears corrective lenses when reading They have regular follow up with the ophthalmologist  Dietary issues and exercise activities discussed: Current Exercise Habits: Home exercise routine, Type of exercise: treadmill;calisthenics, Time (Minutes): 30, Frequency (Times/Week): 3, Weekly Exercise (Minutes/Week): 90, Intensity: Moderate    Goals Addressed               This Visit's Progress     Patient Stated     Weight goal 190lb (pt-stated)   On track     Stay active Stay hydrated Low carb foods       Depression Screen    07/15/2022    2:35 PM 12/13/2021    9:57 AM 07/12/2021    9:55 AM 06/14/2021    9:59 AM 12/12/2020   11:21 AM 12/12/2020   11:20 AM 02/21/2017    9:13 AM  PHQ 2/9 Scores  PHQ - 2 Score 0 0 0 0 0 0 0  PHQ- 9 Score     0  0    Fall Risk    07/15/2022    2:35 PM 12/13/2021    9:57 AM 07/12/2021   10:19 AM 06/14/2021    9:59 AM 12/12/2020   11:20 AM  [place] in the past year? 0 0 0 0 0  Number falls in past yr: 0  0 0   Injury with Fall? 0   0   Risk for fall due to : Impaired balance/gait No Fall Risks  No Fall Risks   Follow up  Falls evaluation completed Falls evaluation completed Falls evaluation completed Falls evaluation completed    [place]: Home free of loose throw rugs in walkways, pet beds, electrical cords, etc? Yes  Adequate lighting in your home to reduce risk of falls? Yes   ASSISTIVE DEVICES UTILIZED TO PREVENT FALLS: Life alert? No  Use of a cane, walker or w/c? No   TIMED UP AND GO: Was the test performed? No .   Cognitive Function:    02/21/2017    9:33 AM  MMSE - Mini Mental State Exam  Orientation to time 5  Orientation to Place 5  Registration 3  Attention/ Calculation 5  Recall 3  Language- name 2 objects 2  Language- repeat 1  Language- follow 3 step command 3  Language- read & follow direction 1  Write a sentence 1  Copy design 1  Total score 30        07/15/2022    2:46 PM 07/12/2021    9:56 AM  6CIT Screen  What Year? 0 points 0 points  What month? 0 points 0 points  What time? 0 points 0 points  Count back from 20 0 points 0 points  Months in reverse 0 points 0 points  Repeat phrase 0 points 0 points  Total Score 0 points 0 points    Immunizations Immunization History  Administered  Date(s) Administered   Hepatitis B, adult 05/11/2020, 06/12/2020   Influenza-Unspecified 07/18/1995   PFIZER(Purple Top)SARS-COV-2 Vaccination 04/24/2020, 05/15/2020   Pneumococcal-Unspecified 02/26/2008   Tdap 06/29/2013    Flu Vaccine status: Due, Education has been provided regarding the importance of this vaccine. Advised may receive this vaccine at local pharmacy or Health Dept. Aware to provide a copy of the vaccination record if obtained from local pharmacy or Health Dept. Verbalized acceptance and understanding.  Covid-19 vaccine status: Completed vaccines x2.  Screening Tests Health Maintenance  Topic Date Due   COVID-19 Vaccine (3 - Pfizer risk series) 07/31/2022 (Originally 06/12/2020)   HIV Screening  08/23/2022 (Originally 07/03/1976)   INFLUENZA VACCINE  12/22/2022 (Originally 04/23/2022)   TETANUS/TDAP  06/30/2023   COLONOSCOPY (Pts 45-78yr Insurance coverage will need to be confirmed)  06/06/2031   Hepatitis C Screening  Completed   Zoster Vaccines- Shingrix  Completed   HPV VACCINES  Aged Out    Health Maintenance There are no preventive care reminders to display for this patient.  Labs followed by PCP. HIV lab deferred.   Lung Cancer Screening: (Low Dose CT Chest recommended if Age 62-80years, 30 pack-year currently smoking OR have quit w/in 15years.) does not qualify.   Hepatitis C Screening: Completed 2022.  Vision Screening: Recommended annual ophthalmology exams for early detection of glaucoma and other disorders of the eye.  Dental Screening: Recommended annual dental exams for proper oral hygiene  Community Resource Referral / Chronic Care Management: CRR required this visit?  No   CCM required this visit?  No      Plan:     I have personally reviewed and noted the following in the patient's chart:   Medical and social history Use of alcohol, tobacco or illicit drugs  Current medications and supplements including opioid prescriptions.  Patient is not currently taking opioid prescriptions. Functional ability and status Nutritional status Physical activity Advanced directives List of other physicians Hospitalizations, surgeries, and ER visits in previous 12 months Vitals Screenings to include cognitive, depression, and falls Referrals and appointments  In addition, I have reviewed and discussed with patient certain preventive protocols, quality metrics, and best practice recommendations. A written personalized care plan for preventive services as well as general preventive health recommendations were provided to patient.     DLeta Jungling LPN   [id number]

## 2022-07-15 NOTE — Patient Instructions (Addendum)
[name] , Thank you for taking time to come for your Medicare Wellness Visit. I appreciate your ongoing commitment to your health goals. Please review the following plan we discussed and let me know if I can assist you in the future.   These are the goals we discussed:  Goals       Patient Stated     Weight goal 190lb (pt-stated)      Stay active Stay hydrated Low carb foods        This is a list of the screening recommended for you and due dates:  Health Maintenance  Topic Date Due   COVID-19 Vaccine (3 - Pfizer risk series) 07/31/2022*   HIV Screening  08/23/2022*   Flu Shot  12/22/2022*   Tetanus Vaccine  06/30/2023   Colon Cancer Screening  06/06/2031   Hepatitis C Screening: USPSTF Recommendation to screen - Ages 18-79 yo.  Completed   Zoster (Shingles) Vaccine  Completed   HPV Vaccine  Aged Out  *Topic was postponed. The date shown is not the original due date.    Advanced directives: End of life planning; Advanced aging; Advanced directives discussed.  No HCPOA/Living Will.  Additional information declined at this time.  Conditions/risks identified: none new  Next appointment: Follow up in one year for your annual wellness visit   Preventive Care 40-64 Years, Male Preventive care refers to lifestyle choices and visits with your health care provider that can promote health and wellness. What does preventive care include? A yearly physical exam. This is also called an annual well check. Dental exams once or twice a year. Routine eye exams. Ask your health care provider how often you should have your eyes checked. Personal lifestyle choices, including: Daily care of your teeth and gums. Regular physical activity. Eating a healthy diet. Avoiding tobacco and drug use. Limiting alcohol use. Practicing safe sex. Taking low-dose aspirin every day starting at age 54. What happens during an annual well check? The services and screenings done by your health care  provider during your annual well check will depend on your age, overall health, lifestyle risk factors, and family history of disease. Counseling  Your health care provider may ask you questions about your: Alcohol use. Tobacco use. Drug use. Emotional well-being. Home and relationship well-being. Sexual activity. Eating habits. Work and work Statistician. Screening  You may have the following tests or measurements: Height, weight, and BMI. Blood pressure. Lipid and cholesterol levels. These may be checked every 5 years, or more frequently if you are over 17 years old. Skin check. Lung cancer screening. You may have this screening every year starting at age 11 if you have a 30-pack-year history of smoking and currently smoke or have quit within the past 15 years. Fecal occult blood test (FOBT) of the stool. You may have this test every year starting at age 26. Flexible sigmoidoscopy or colonoscopy. You may have a sigmoidoscopy every 5 years or a colonoscopy every 10 years starting at age 30. Prostate cancer screening. Recommendations will vary depending on your family history and other risks. Hepatitis C blood test. Hepatitis B blood test. Sexually transmitted disease (STD) testing. Diabetes screening. This is done by checking your blood sugar (glucose) after you have not eaten for a while (fasting). You may have this done every 1-3 years. Discuss your test results, treatment options, and if necessary, the need for more tests with your health care provider. Vaccines  Your health care provider may recommend certain vaccines, such  as: Influenza vaccine. This is recommended every year. Tetanus, diphtheria, and acellular pertussis (Tdap, Td) vaccine. You may need a Td booster every 10 years. Zoster vaccine. You may need this after age 44. Pneumococcal 13-valent conjugate (PCV13) vaccine. You may need this if you have certain conditions and have not been vaccinated. Pneumococcal  polysaccharide (PPSV23) vaccine. You may need one or two doses if you smoke cigarettes or if you have certain conditions. Talk to your health care provider about which screenings and vaccines you need and how often you need them. This information is not intended to replace advice given to you by your health care provider. Make sure you discuss any questions you have with your health care provider. Document Released: 10/06/2015 Document Revised: 05/29/2016 Document Reviewed: 07/11/2015 Elsevier Interactive Patient Education  2017 [place] Prevention in the Home Falls can cause injuries. They can happen to people of all ages. There are many things you can do to make your home safe and to help prevent falls. What can I do on the outside of my home? Regularly fix the edges of walkways and driveways and fix any cracks. Remove anything that might make you trip as you walk through a door, such as a raised step or threshold. Trim any bushes or trees on the path to your home. Use bright outdoor lighting. Clear any walking paths of anything that might make someone trip, such as rocks or tools. Regularly check to see if handrails are loose or broken. Make sure that both sides of any steps have handrails. Any raised decks and porches should have guardrails on the edges. Have any leaves, snow, or ice cleared regularly. Use sand or salt on walking paths during winter. Clean up any spills in your garage right away. This includes oil or grease spills. What can I do in the bathroom? Use night lights. Install grab bars by the toilet and in the tub and shower. Do not use towel bars as grab bars. Use non-skid mats or decals in the tub or shower. If you need to sit down in the shower, use a plastic, non-slip stool. Keep the floor dry. Clean up any water that spills on the floor as soon as it happens. Remove soap buildup in the tub or shower regularly. Attach bath mats securely with double-sided  non-slip rug tape. Do not have throw rugs and other things on the floor that can make you trip. What can I do in the bedroom? Use night lights. Make sure that you have a light by your bed that is easy to reach. Do not use any sheets or blankets that are too big for your bed. They should not hang down onto the floor. Have a firm chair that has side arms. You can use this for support while you get dressed. Do not have throw rugs and other things on the floor that can make you trip. What can I do in the kitchen? Clean up any spills right away. Avoid walking on wet floors. Keep items that you use a lot in easy-to-reach places. If you need to reach something above you, use a strong step stool that has a grab bar. Keep electrical cords out of the way. Do not use floor polish or wax that makes floors slippery. If you must use wax, use non-skid floor wax. Do not have throw rugs and other things on the floor that can make you trip. What can I do with my stairs? Do not leave any items  on the stairs. Make sure that there are handrails on both sides of the stairs and use them. Fix handrails that are broken or loose. Make sure that handrails are as long as the stairways. Check any carpeting to make sure that it is firmly attached to the stairs. Fix any carpet that is loose or worn. Avoid having throw rugs at the top or bottom of the stairs. If you do have throw rugs, attach them to the floor with carpet tape. Make sure that you have a light switch at the top of the stairs and the bottom of the stairs. If you do not have them, ask someone to add them for you. What else can I do to help prevent falls? Wear shoes that: Do not have high heels. Have rubber bottoms. Are comfortable and fit you well. Are closed at the toe. Do not wear sandals. If you use a stepladder: Make sure that it is fully opened. Do not climb a closed stepladder. Make sure that both sides of the stepladder are locked into place. Ask  someone to hold it for you, if possible. Clearly mark and make sure that you can see: Any grab bars or handrails. First and last steps. Where the edge of each step is. Use tools that help you move around (mobility aids) if they are needed. These include: Canes. Walkers. Scooters. Crutches. Turn on the lights when you go into a dark area. Replace any light bulbs as soon as they burn out. Set up your furniture so you have a clear path. Avoid moving your furniture around. If any of your floors are uneven, fix them. If there are any pets around you, be aware of where they are. Review your medicines with your doctor. Some medicines can make you feel dizzy. This can increase your chance of falling. Ask your doctor what other things that you can do to help prevent falls. This information is not intended to replace advice given to you by your health care provider. Make sure you discuss any questions you have with your health care provider. Document Released: 07/06/2009 Document Revised: 02/15/2016 Document Reviewed: 10/14/2014 Elsevier Interactive Patient Education  2017 [employer].

## 2022-07-22 ENCOUNTER — Encounter (INDEPENDENT_AMBULATORY_CARE_PROVIDER_SITE_OTHER): Payer: Self-pay

## 2022-07-31 ENCOUNTER — Ambulatory Visit (INDEPENDENT_AMBULATORY_CARE_PROVIDER_SITE_OTHER): Payer: Medicare Other | Admitting: Internal Medicine

## 2022-07-31 ENCOUNTER — Encounter: Payer: Self-pay | Admitting: Internal Medicine

## 2022-07-31 VITALS — BP 108/76 | HR 82 | Temp 97.8°F | Resp 16 | Ht 67.0 in | Wt 209.8 lb

## 2022-07-31 DIAGNOSIS — Z1211 Encounter for screening for malignant neoplasm of colon: Secondary | ICD-10-CM

## 2022-07-31 DIAGNOSIS — R7989 Other specified abnormal findings of blood chemistry: Secondary | ICD-10-CM | POA: Diagnosis not present

## 2022-07-31 DIAGNOSIS — D472 Monoclonal gammopathy: Secondary | ICD-10-CM | POA: Diagnosis not present

## 2022-07-31 DIAGNOSIS — D649 Anemia, unspecified: Secondary | ICD-10-CM

## 2022-07-31 DIAGNOSIS — I10 Essential (primary) hypertension: Secondary | ICD-10-CM | POA: Diagnosis not present

## 2022-07-31 DIAGNOSIS — E7800 Pure hypercholesterolemia, unspecified: Secondary | ICD-10-CM | POA: Diagnosis not present

## 2022-07-31 DIAGNOSIS — N185 Chronic kidney disease, stage 5: Secondary | ICD-10-CM

## 2022-07-31 DIAGNOSIS — F319 Bipolar disorder, unspecified: Secondary | ICD-10-CM

## 2022-07-31 NOTE — Progress Notes (Signed)
Patient ID: Donald Small, male   DOB: 1961-06-21, 61 y.o.   MRN: [id number]   Subjective:    Patient ID: Donald Small, male    DOB: [birth year], 61 y.o.   MRN: [id number]   Patient here for  Chief Complaint  Patient presents with   Follow-up   Hypertension   .   HPI Here to follow up regarding hypertension and CKD.  Has AVF- on transplant list. Brother and daughter are interested in being donors.  Being followed by nephrology.  Reports he is doing relatively well.  Trying to stay active.  No chest pain or sob reported.  No abdominal pain or bowel change reported.  Blood pressure doing well.    Past Medical History:  Diagnosis Date   Arthritis    Benign prostatic hypertrophy    Chronic kidney disease    Depression    With psychotic features   Hypertension    Past Surgical History:  Procedure Laterality Date   A/V FISTULAGRAM Left 03/18/2022   Procedure: A/V Fistulagram;  Surgeon: Algernon Huxley, MD;  Location: [facility];  Service: Cardiovascular;  Laterality: Left;   AV FISTULA PLACEMENT Left    COLONOSCOPY WITH PROPOFOL N/A 06/05/2021   Procedure: COLONOSCOPY WITH PROPOFOL;  Surgeon: Lucilla Lame, MD;  Location: [facility];  Service: Endoscopy;  Laterality: N/A;   CYST EXCISION Left 11/06/2013   ganglion cyst removed left hand   CYST EXCISION     Family History  Problem Relation Age of Onset   Hypertension Mother    Arthritis Father    Hypertension Father    Cancer Father        prostate cancer   Stroke Father    Diabetes Brother    Social History   Socioeconomic History   Marital status: Married    Spouse name: Not on file   Number of children: Not on file   Years of education: Not on file   Highest education level: Not on file  Occupational History   Not on file  Tobacco Use   Smoking status: Former    Packs/day: 2.00    Years: 15.00    Total pack years: 30.00    Types: Cigarettes    Quit date: 09/23/1988    Years since  quitting: 33.8   Smokeless tobacco: Never  Vaping Use   Vaping Use: Never used  Substance and Sexual Activity   Alcohol use: No    Alcohol/week: 0.0 standard drinks of alcohol   Drug use: No   Sexual activity: Yes  Other Topics Concern   Not on file  Social History Narrative   Not on file   Social Determinants of Health   Financial Resource Strain: Low Risk  (07/15/2022)   Overall Financial Resource Strain (CARDIA)    Difficulty of Paying Living Expenses: Not hard at all  Food Insecurity: No Food Insecurity (07/15/2022)   Hunger Vital Sign    Worried About Running Out of Food in the Last Year: Never true    Ran Out of Food in the Last Year: Never true  Transportation Needs: No Transportation Needs (07/15/2022)   PRAPARE - Hydrologist (Medical): No    Lack of Transportation (Non-Medical): No  Physical Activity: Insufficiently Active (07/15/2022)   Exercise Vital Sign    Days of Exercise per Week: 3 days    Minutes of Exercise per Session: 30 min  Stress: No Stress Concern Present (07/15/2022)  [employer] Questionnaire    Feeling of Stress : Not at all  Social Connections: Unknown (07/15/2022)   Social Connection and Isolation Panel [NHANES]    Frequency of Communication with Friends and Family: More than three times a week    Frequency of Social Gatherings with Friends and Family: More than three times a week    Attends Religious Services: Not on Advertising copywriter or Organizations: Not on file    Attends Archivist Meetings: Not on file    Marital Status: Not on file     Review of Systems  Constitutional:  Negative for appetite change and unexpected weight change.  HENT:  Negative for congestion and sinus pressure.   Respiratory:  Negative for cough, chest tightness and shortness of breath.   Cardiovascular:  Negative for chest pain, palpitations and leg swelling.   Gastrointestinal:  Negative for abdominal pain, diarrhea, nausea and vomiting.  Genitourinary:  Negative for difficulty urinating and dysuria.  Musculoskeletal:  Negative for joint swelling and myalgias.  Skin:  Negative for color change and rash.  Neurological:  Negative for dizziness and headaches.  Psychiatric/Behavioral:  Negative for agitation and dysphoric mood.        Objective:     BP 108/76   Pulse 82   Temp 97.8 F (36.6 C) (Temporal)   Resp 16   Ht '5\' 7"'$  (1.702 m)   Wt 209 lb 12.8 oz (95.2 kg)   SpO2 96%   BMI 32.86 kg/m  Wt Readings from Last 3 Encounters:  07/31/22 209 lb 12.8 oz (95.2 kg)  07/15/22 208 lb (94.3 kg)  03/18/22 208 lb 15.9 oz (94.8 kg)    Physical Exam Vitals reviewed.  Constitutional:      General: He is not in acute distress.    Appearance: Normal appearance. He is well-developed.  HENT:     Head: Normocephalic and atraumatic.     Right Ear: External ear normal.     Left Ear: External ear normal.  Eyes:     General: No scleral icterus.       Right eye: No discharge.        Left eye: No discharge.     Conjunctiva/sclera: Conjunctivae normal.  Cardiovascular:     Rate and Rhythm: Normal rate and regular rhythm.  Pulmonary:     Effort: Pulmonary effort is normal. No respiratory distress.     Breath sounds: Normal breath sounds.  Abdominal:     General: Bowel sounds are normal.     Palpations: Abdomen is soft.     Tenderness: There is no abdominal tenderness.  Musculoskeletal:        General: No swelling or tenderness.     Cervical back: Neck supple. No tenderness.  Lymphadenopathy:     Cervical: No cervical adenopathy.  Skin:    Findings: No erythema or rash.  Neurological:     Mental Status: He is alert.  Psychiatric:        Mood and Affect: Mood normal.        Behavior: Behavior normal.      Outpatient Encounter Medications as of 07/31/2022  Medication Sig   aMILoride (MIDAMOR) 5 MG tablet Take 10 mg by mouth daily.    amLODipine (NORVASC) 5 MG tablet Take 10 mg by mouth in the morning and at bedtime.   ascorbic acid (VITAMIN C) 1000 MG tablet Take by mouth.   Calcium Citrate-Vitamin  D (CALCIUM + D PO) Take by mouth.   Calcium Polycarbophil (FIBER) 625 MG TABS Take by mouth.   carvedilol (COREG) 12.5 MG tablet Take 12.5 mg by mouth 2 (two) times daily with a meal.   FINASTERIDE PO Take by mouth.   FOLIC ACID PO Take by mouth.   furosemide (LASIX) 40 MG tablet Take 40 mg by mouth daily.   hydrALAZINE (APRESOLINE) 100 MG tablet Take 1 tablet by mouth 2 (two) times daily.   Omega-3 Fatty Acids (FISH OIL PO) Take 1 capsule by mouth 2 (two) times daily.    pravastatin (PRAVACHOL) 40 MG tablet TAKE ONE TABLET BY MOUTH AT BEDTIME FOR CHOLESTEROL TO REPLACE ATORVASTATIN   sevelamer carbonate (RENVELA) 800 MG tablet Take by mouth.   tamsulosin (FLOMAX) 0.4 MG CAPS capsule Take 0.8 mg by mouth daily.    No facility-administered encounter medications on file as of 07/31/2022.     Lab Results  Component Value Date   WBC 5.9 06/19/2021   HGB 10.9 (A) 06/19/2021   HCT 31 (A) 06/19/2021   PLT 186 06/19/2021   GLUCOSE 88 04/19/2015   CHOL 118 06/19/2021   TRIG 63 06/19/2021   HDL 54 06/19/2021   LDLCALC 51 06/19/2021   ALT 41 (A) 06/19/2021   AST 13 (A) 06/19/2021   NA 140 06/19/2021   K 4.0 06/19/2021   CL 108 06/19/2021   CREATININE 6.9 (A) 06/19/2021   BUN 80 (A) 06/19/2021   CO2 25 (A) 06/19/2021   TSH 1.54 06/19/2021   HGBA1C 5.7 06/19/2021    PERIPHERAL VASCULAR CATHETERIZATION  Result Date: 03/18/2022 See surgical note for result.      Assessment & Plan:   Problem List Items Addressed This Visit     Abnormal liver function tests - Primary    Previously worked up at the [place]. Felt to be related to lithium and hctz.  Off hctz and lithium.  Gets labs through [place]. Follow.       Anemia    Followed by hematology. Baseline hgb has been around 10.       Bipolar disorder, unspecified ([place])     Followed by psychiatry.  Off lithium.        CKD (chronic kidney disease) stage 5, GFR less than 15 ml/min (HCC)    Has AVF.  Has not started dialysis.  On transplant list.  Followed by nephrology.       Colon cancer screening    Colonoscopy 05/2021.       Essential hypertension, benign    Blood pressure as outlined. Continue coreg, norvasc, hydralazine and lasix.  Follow pressures.       Hypercholesterolemia    Low cholesterol diet and exercise.  Continue pravastatin.  Follow lipid panel and liver function tests.       MGUS (monoclonal gammopathy of unknown significance)    Followed by hematology.         Einar Pheasant, MD

## 2022-08-05 ENCOUNTER — Encounter: Payer: Self-pay | Admitting: Internal Medicine

## 2022-08-05 NOTE — Assessment & Plan Note (Signed)
Blood pressure as outlined. Continue coreg, norvasc, hydralazine and lasix.  Follow pressures.

## 2022-08-05 NOTE — Assessment & Plan Note (Signed)
Previously worked up at the [place]. Felt to be related to lithium and hctz.  Off hctz and lithium.  Gets labs through [place]. Follow.

## 2022-08-05 NOTE — Assessment & Plan Note (Signed)
Has AVF.  Has not started dialysis.  On transplant list.  Followed by nephrology.

## 2022-08-05 NOTE — Assessment & Plan Note (Signed)
Followed by psychiatry.  Off lithium.

## 2022-08-05 NOTE — Assessment & Plan Note (Signed)
Colonoscopy 05/2021.  

## 2022-08-05 NOTE — Assessment & Plan Note (Signed)
Followed by hematology 

## 2022-08-05 NOTE — Assessment & Plan Note (Signed)
Followed by hematology. Baseline hgb has been around 10.

## 2022-08-05 NOTE — Assessment & Plan Note (Signed)
Low cholesterol diet and exercise.  Continue pravastatin.  Follow lipid panel and liver function tests.

## 2022-08-21 DIAGNOSIS — H9201 Otalgia, right ear: Secondary | ICD-10-CM | POA: Diagnosis not present

## 2022-08-21 DIAGNOSIS — R070 Pain in throat: Secondary | ICD-10-CM | POA: Diagnosis not present

## 2022-08-21 DIAGNOSIS — B349 Viral infection, unspecified: Secondary | ICD-10-CM | POA: Diagnosis not present

## 2022-10-29 DIAGNOSIS — Z01818 Encounter for other preprocedural examination: Secondary | ICD-10-CM | POA: Diagnosis not present

## 2022-12-30 DIAGNOSIS — Z01818 Encounter for other preprocedural examination: Secondary | ICD-10-CM | POA: Diagnosis not present

## 2023-01-01 DIAGNOSIS — G2581 Restless legs syndrome: Secondary | ICD-10-CM | POA: Diagnosis not present

## 2023-01-01 DIAGNOSIS — Z87891 Personal history of nicotine dependence: Secondary | ICD-10-CM | POA: Diagnosis not present

## 2023-01-01 DIAGNOSIS — N185 Chronic kidney disease, stage 5: Secondary | ICD-10-CM | POA: Diagnosis not present

## 2023-01-01 DIAGNOSIS — I120 Hypertensive chronic kidney disease with stage 5 chronic kidney disease or end stage renal disease: Secondary | ICD-10-CM | POA: Diagnosis not present

## 2023-01-02 ENCOUNTER — Telehealth: Payer: Self-pay | Admitting: Internal Medicine

## 2023-01-02 DIAGNOSIS — G2581 Restless legs syndrome: Secondary | ICD-10-CM | POA: Diagnosis not present

## 2023-01-02 NOTE — Telephone Encounter (Signed)
Pt wife called stating pt went to the ER on yesterday for restless legs and he was given one gabapentin. Pt wife want to know if he is able to get more or does he need to see the provider

## 2023-01-02 NOTE — Telephone Encounter (Signed)
Saw you in Nov 2023 and has 25m f/u in May. He is followed by kidney specialists and on a transplant list. Is this something he should consult with them about taking?

## 2023-01-03 NOTE — Telephone Encounter (Signed)
Given his CKD, I would like for him to confirm with his nephrologist that they are ok with him taking.  If so, I am ok with refilling the low dose of gabapentin.  Let me know if any questions or problems.

## 2023-01-03 NOTE — Telephone Encounter (Signed)
LM for nephrology to return my call. Patient is aware the need to consult with them first prior to refilling.

## 2023-01-03 NOTE — Telephone Encounter (Signed)
Prescription Request  01/03/2023  LOV: 07/31/2022  What is the name of the medication or equipment? gabapentin  Have you contacted your pharmacy to request a refill? No   Which pharmacy would you like this sent to?   [facility] [address] ([address]), [place] - [address] [place]) [place] [address] Phone: [phone] Fax: [phone]    Patient notified that their request is being sent to the clinical staff for review and that they should receive a response within 2 business days.   Please advise at Mobile [phone] (mobile)   Pt stayed that this was giving in the ED, but only one dose, and was wondering if Dr.Scott can write another xr for him?

## 2023-01-07 NOTE — Telephone Encounter (Signed)
Spoke with patient. He is seeing nephrology tomorrow and will discuss with nephrology about prescribing.

## 2023-02-04 ENCOUNTER — Ambulatory Visit: Payer: Medicare Other | Admitting: Internal Medicine

## 2023-02-05 ENCOUNTER — Ambulatory Visit (INDEPENDENT_AMBULATORY_CARE_PROVIDER_SITE_OTHER): Payer: Medicare Other | Admitting: Internal Medicine

## 2023-02-05 ENCOUNTER — Encounter: Payer: Self-pay | Admitting: Internal Medicine

## 2023-02-05 VITALS — BP 124/76 | HR 64 | Temp 97.9°F | Ht 67.0 in | Wt 195.8 lb

## 2023-02-05 DIAGNOSIS — D472 Monoclonal gammopathy: Secondary | ICD-10-CM

## 2023-02-05 DIAGNOSIS — F319 Bipolar disorder, unspecified: Secondary | ICD-10-CM | POA: Diagnosis not present

## 2023-02-05 DIAGNOSIS — D649 Anemia, unspecified: Secondary | ICD-10-CM

## 2023-02-05 DIAGNOSIS — E7800 Pure hypercholesterolemia, unspecified: Secondary | ICD-10-CM | POA: Diagnosis not present

## 2023-02-05 DIAGNOSIS — M3320 Polymyositis, organ involvement unspecified: Secondary | ICD-10-CM

## 2023-02-05 DIAGNOSIS — G2581 Restless legs syndrome: Secondary | ICD-10-CM

## 2023-02-05 DIAGNOSIS — N185 Chronic kidney disease, stage 5: Secondary | ICD-10-CM | POA: Diagnosis not present

## 2023-02-05 DIAGNOSIS — I10 Essential (primary) hypertension: Secondary | ICD-10-CM | POA: Diagnosis not present

## 2023-02-05 DIAGNOSIS — Z992 Dependence on renal dialysis: Secondary | ICD-10-CM | POA: Diagnosis not present

## 2023-02-05 DIAGNOSIS — Z1211 Encounter for screening for malignant neoplasm of colon: Secondary | ICD-10-CM

## 2023-02-05 NOTE — Progress Notes (Signed)
Subjective:    Patient ID: Donald Small, male    DOB: 1961-08-26, 62 y.o.   MRN: [id number]  Patient here for  Chief Complaint  Patient presents with   Medical Management of Chronic Issues    HPI Here to follow up regarding his blood pressure, cholesterol and kidney function. Have not seen him in over one year. Evaluated nephrology 01/08/23 - CKD V. Started dialysis - T, Th and Saturday. Restless legs - stopped.  Taking gabapentin 100mg  q hs.  Undergoing evaluation for transplant.  Tries to stay active.  No chest pain or sob reported.  No abdominal pain reported.  Occasional constipation.    Past Medical History:  Diagnosis Date   Arthritis    Benign prostatic hypertrophy    Chronic kidney disease    Depression    With psychotic features   Hypertension    Past Surgical History:  Procedure Laterality Date   A/V FISTULAGRAM Left 03/18/2022   Procedure: A/V Fistulagram;  Surgeon: Annice Needy, MD;  Location: [facility];  Service: Cardiovascular;  Laterality: Left;   AV FISTULA PLACEMENT Left    COLONOSCOPY WITH PROPOFOL N/A 06/05/2021   Procedure: COLONOSCOPY WITH PROPOFOL;  Surgeon: Midge Minium, MD;  Location: [facility];  Service: Endoscopy;  Laterality: N/A;   CYST EXCISION Left 11/06/2013   ganglion cyst removed left hand   CYST EXCISION     Family History  Problem Relation Age of Onset   Hypertension Mother    Arthritis Father    Hypertension Father    Cancer Father        prostate cancer   Stroke Father    Diabetes Brother    Social History   Socioeconomic History   Marital status: Married    Spouse name: Not on file   Number of children: Not on file   Years of education: Not on file   Highest education level: Not on file  Occupational History   Not on file  Tobacco Use   Smoking status: Former    Packs/day: 2.00    Years: 15.00    Additional pack years: 0.00    Total pack years: 30.00    Types: Cigarettes    Quit date: 09/23/1988     Years since quitting: 34.4   Smokeless tobacco: Never  Vaping Use   Vaping Use: Never used  Substance and Sexual Activity   Alcohol use: No    Alcohol/week: 0.0 standard drinks of alcohol   Drug use: No   Sexual activity: Yes  Other Topics Concern   Not on file  Social History Narrative   Not on file   Social Determinants of Health   Financial Resource Strain: Low Risk  (07/15/2022)   Overall Financial Resource Strain (CARDIA)    Difficulty of Paying Living Expenses: Not hard at all  Food Insecurity: No Food Insecurity (07/15/2022)   Hunger Vital Sign    Worried About Running Out of Food in the Last Year: Never true    Ran Out of Food in the Last Year: Never true  Transportation Needs: No Transportation Needs (07/15/2022)   PRAPARE - Administrator, Civil Service (Medical): No    Lack of Transportation (Non-Medical): No  Physical Activity: Insufficiently Active (07/15/2022)   Exercise Vital Sign    Days of Exercise per Week: 3 days    Minutes of Exercise per Session: 30 min  Stress: No Stress Concern Present (07/15/2022)   [employer] of  Occupational Health - Occupational Stress Questionnaire    Feeling of Stress : Not at all  Social Connections: Unknown (07/15/2022)   Social Connection and Isolation Panel [NHANES]    Frequency of Communication with Friends and Family: More than three times a week    Frequency of Social Gatherings with Friends and Family: More than three times a week    Attends Religious Services: Not on Marketing executive or Organizations: Not on file    Attends Banker Meetings: Not on file    Marital Status: Not on file     Review of Systems  Constitutional:  Negative for appetite change and unexpected weight change.  HENT:  Negative for congestion and sinus pressure.   Respiratory:  Negative for cough, chest tightness and shortness of breath.   Cardiovascular:  Negative for chest pain and palpitations.   Gastrointestinal:  Negative for abdominal pain, diarrhea, nausea and vomiting.  Genitourinary:  Negative for difficulty urinating and dysuria.  Musculoskeletal:  Negative for joint swelling and myalgias.  Skin:  Negative for color change and rash.  Neurological:  Negative for dizziness and headaches.  Psychiatric/Behavioral:  Negative for agitation and dysphoric mood.        Objective:     BP 124/76   Pulse 64   Temp 97.9 F (36.6 C) (Oral)   Ht 5\' 7"  (1.702 m)   Wt 195 lb 12.8 oz (88.8 kg)   SpO2 95%   BMI 30.67 kg/m  Wt Readings from Last 3 Encounters:  02/05/23 195 lb 12.8 oz (88.8 kg)  07/31/22 209 lb 12.8 oz (95.2 kg)  07/15/22 208 lb (94.3 kg)    Physical Exam Vitals reviewed.  Constitutional:      General: He is not in acute distress.    Appearance: Normal appearance. He is well-developed.  HENT:     Head: Normocephalic and atraumatic.     Right Ear: External ear normal.     Left Ear: External ear normal.  Eyes:     General: No scleral icterus.       Right eye: No discharge.        Left eye: No discharge.     Conjunctiva/sclera: Conjunctivae normal.  Cardiovascular:     Rate and Rhythm: Normal rate and regular rhythm.  Pulmonary:     Effort: Pulmonary effort is normal. No respiratory distress.     Breath sounds: Normal breath sounds.  Abdominal:     General: Bowel sounds are normal.     Palpations: Abdomen is soft.     Tenderness: There is no abdominal tenderness.  Musculoskeletal:        General: No swelling or tenderness.     Cervical back: Neck supple. No tenderness.  Lymphadenopathy:     Cervical: No cervical adenopathy.  Skin:    Findings: No erythema or rash.  Neurological:     Mental Status: He is alert.  Psychiatric:        Mood and Affect: Mood normal.        Behavior: Behavior normal.      Outpatient Encounter Medications as of 02/05/2023  Medication Sig   aMILoride (MIDAMOR) 5 MG tablet Take 10 mg by mouth daily.   amLODipine  (NORVASC) 5 MG tablet Take 10 mg by mouth in the morning and at bedtime.   ascorbic acid (VITAMIN C) 1000 MG tablet Take by mouth.   Calcium Citrate-Vitamin D (CALCIUM + D PO) Take by mouth.  Calcium Polycarbophil (FIBER) 625 MG TABS Take by mouth.   carvedilol (COREG) 12.5 MG tablet Take 12.5 mg by mouth 2 (two) times daily with a meal.   FINASTERIDE PO Take by mouth.   furosemide (LASIX) 40 MG tablet Take 40 mg by mouth daily.   hydrALAZINE (APRESOLINE) 100 MG tablet Take 1 tablet by mouth 2 (two) times daily.   Omega-3 Fatty Acids (FISH OIL PO) Take 1 capsule by mouth 2 (two) times daily.    pravastatin (PRAVACHOL) 40 MG tablet TAKE ONE TABLET BY MOUTH AT BEDTIME FOR CHOLESTEROL TO REPLACE ATORVASTATIN   sevelamer carbonate (RENVELA) 800 MG tablet Take by mouth.   tamsulosin (FLOMAX) 0.4 MG CAPS capsule Take 0.8 mg by mouth daily.    [DISCONTINUED] FOLIC ACID PO Take by mouth. (Patient not taking: Reported on 02/05/2023)   No facility-administered encounter medications on file as of 02/05/2023.     Lab Results  Component Value Date   WBC 5.9 06/19/2021   HGB 10.9 (A) 06/19/2021   HCT 31 (A) 06/19/2021   PLT 186 06/19/2021   GLUCOSE 88 04/19/2015   CHOL 118 06/19/2021   TRIG 63 06/19/2021   HDL 54 06/19/2021   LDLCALC 51 06/19/2021   ALT 41 (A) 06/19/2021   AST 13 (A) 06/19/2021   NA 140 06/19/2021   K 4.0 06/19/2021   CL 108 06/19/2021   CREATININE 6.9 (A) 06/19/2021   BUN 80 (A) 06/19/2021   CO2 25 (A) 06/19/2021   TSH 1.54 06/19/2021   HGBA1C 5.7 06/19/2021    PERIPHERAL VASCULAR CATHETERIZATION  Result Date: 03/18/2022 See surgical note for result.      Assessment & Plan:  Anemia, unspecified type Assessment & Plan: Has been followed by hematology. Baseline hgb has been around 10.  01/08/23 - hgb - 10.8.    Bipolar affective disorder, remission status unspecified (HCC) Assessment & Plan: Followed by psychiatry.  Off lithium.     CKD (chronic kidney  disease) stage 5, GFR less than 15 ml/min (HCC) Assessment & Plan: Has started dialysis as outlined.  Being evaluated for transplant.  Follow.   Colon cancer screening Assessment & Plan: Colonoscopy 05/2021.    Essential hypertension, benign Assessment & Plan: Blood pressure as outlined. Continue coreg, norvasc, hydralazine and lasix.  Follow pressures.    Hypercholesterolemia Assessment & Plan: Low cholesterol diet and exercise.  Continue pravastatin.  Follow lipid panel and liver function tests.    MGUS (monoclonal gammopathy of unknown significance) Assessment & Plan: Followed by hematology.    Polymyositis (HCC) Assessment & Plan: Off methotrexate.  Overall stable.  Follow.     Dependence on renal dialysis [facility]) Assessment & Plan: Just recently started dialysis - T,Th and Saturday.  Followed by nephrology.    Restless leg syndrome Assessment & Plan: Better.  On gabapentin 100mg  q hs.  Follow.       Dale Brewster, MD

## 2023-02-17 ENCOUNTER — Encounter: Payer: Self-pay | Admitting: Internal Medicine

## 2023-02-17 DIAGNOSIS — Z992 Dependence on renal dialysis: Secondary | ICD-10-CM | POA: Insufficient documentation

## 2023-02-17 DIAGNOSIS — G2581 Restless legs syndrome: Secondary | ICD-10-CM | POA: Insufficient documentation

## 2023-02-17 NOTE — Assessment & Plan Note (Signed)
Off methotrexate.  Overall stable.  Follow.   

## 2023-02-17 NOTE — Assessment & Plan Note (Signed)
Followed by hematology 

## 2023-02-17 NOTE — Assessment & Plan Note (Signed)
Colonoscopy 05/2021.  

## 2023-02-17 NOTE — Assessment & Plan Note (Signed)
Has been followed by hematology. Baseline hgb has been around 10.  01/08/23 - hgb - 10.8.

## 2023-02-17 NOTE — Assessment & Plan Note (Signed)
Just recently started dialysis - T,Th and Saturday.  Followed by nephrology.

## 2023-02-17 NOTE — Assessment & Plan Note (Signed)
Has started dialysis as outlined.  Being evaluated for transplant.  Follow.

## 2023-02-17 NOTE — Assessment & Plan Note (Signed)
Low cholesterol diet and exercise.  Continue pravastatin.  Follow lipid panel and liver function tests.   

## 2023-02-17 NOTE — Assessment & Plan Note (Signed)
Blood pressure as outlined. Continue coreg, norvasc, hydralazine and lasix.  Follow pressures.  

## 2023-02-17 NOTE — Assessment & Plan Note (Signed)
Followed by psychiatry.  Off lithium.   ?

## 2023-02-17 NOTE — Assessment & Plan Note (Signed)
Better.  On gabapentin 100mg  q hs.  Follow.

## 2023-02-21 DIAGNOSIS — N186 End stage renal disease: Secondary | ICD-10-CM | POA: Diagnosis not present

## 2023-02-21 DIAGNOSIS — I158 Other secondary hypertension: Secondary | ICD-10-CM | POA: Diagnosis not present

## 2023-02-21 DIAGNOSIS — Z992 Dependence on renal dialysis: Secondary | ICD-10-CM | POA: Diagnosis not present

## 2023-03-03 DIAGNOSIS — Z01818 Encounter for other preprocedural examination: Secondary | ICD-10-CM | POA: Diagnosis not present

## 2023-06-27 DIAGNOSIS — Z01810 Encounter for preprocedural cardiovascular examination: Secondary | ICD-10-CM | POA: Diagnosis not present

## 2023-07-30 ENCOUNTER — Ambulatory Visit: Payer: Medicare Other | Admitting: *Deleted

## 2023-07-30 VITALS — BP 120/76 | Ht 67.0 in | Wt 195.0 lb

## 2023-07-30 DIAGNOSIS — Z0000 Encounter for general adult medical examination without abnormal findings: Secondary | ICD-10-CM | POA: Diagnosis not present

## 2023-07-30 NOTE — Patient Instructions (Signed)
Donald Small , Thank you for taking time to come for your Medicare Wellness Visit. I appreciate your ongoing commitment to your health goals. Please review the following plan we discussed and let me know if I can assist you in the future.   Referrals/Orders/Follow-Ups/Clinician Recommendations: None  This is a list of the screening recommended for you and due dates:  Health Maintenance  Topic Date Due   HIV Screening  Never done   COVID-19 Vaccine (4 - 2023-24 season) 09/17/2023   Medicare Annual Wellness Visit  07/29/2024   Colon Cancer Screening  06/06/2031   DTaP/Tdap/Td vaccine (3 - Td or Tdap) 06/15/2033   Flu Shot  Completed   Hepatitis C Screening  Completed   Zoster (Shingles) Vaccine  Completed   HPV Vaccine  Aged Out    Advanced directives: (Declined) Advance directive discussed with you today. Even though you declined this today, please call our office should you change your mind, and we can give you the proper paperwork for you to fill out.  Next Medicare Annual Wellness Visit scheduled for next year: Yes 08/04/24 @ 1:25

## 2023-07-30 NOTE — Progress Notes (Signed)
Subjective:   Donald Small is a 62 y.o. male who presents for Medicare Annual/Subsequent preventive examination.  Visit Complete: Virtual I connected with  Donald Small on 07/30/23 by a audio enabled telemedicine application and verified that I am speaking with the correct person using two identifiers.  Patient Location: Home  Provider Location: Office/Clinic  I discussed the limitations of evaluation and management by telemedicine. The patient expressed understanding and agreed to proceed.  Vital Signs: Because this visit was a virtual/telehealth visit, some criteria may be missing or patient reported. Any vitals not documented were not able to be obtained and vitals that have been documented are patient reported.   Cardiac Risk Factors include: advanced age (>71men, >10 women);obesity (BMI >30kg/m2);dyslipidemia;hypertension     Objective:    Today's Vitals   07/30/23 1250  BP: 120/76  Weight: 195 lb (88.5 kg)  Height: 5\' 7"  (1.702 m)   Body mass index is 30.54 kg/m.     07/30/2023    1:05 PM 07/15/2022    2:37 PM 03/18/2022   10:44 AM 07/12/2021   10:01 AM 06/05/2021    8:38 AM 02/21/2017    9:10 AM 03/04/2015    3:27 AM  Advanced Directives  Does Patient Have a Medical Advance Directive? No No No Yes Yes No No  Type of Aeronautical engineer of [place];Living will Healthcare Power of [place];Living will    Does patient want to make changes to medical advance directive?  No - Patient declined  No - Patient declined     Copy of Healthcare Power of Attorney in Chart?    No - copy requested No - copy requested    Would patient like information on creating a medical advance directive? No - Patient declined  No - Patient declined   Yes (MAU/Ambulatory/Procedural Areas - Information given) No - patient declined information    Current Medications (verified) Outpatient Encounter Medications as of 07/30/2023  Medication Sig   ascorbic acid  (VITAMIN C) 1000 MG tablet Take by mouth.   Calcium Citrate-Vitamin D (CALCIUM + D PO) Take by mouth.   carvedilol (COREG) 12.5 MG tablet Take 12.5 mg by mouth 2 (two) times daily with a meal.   docusate sodium (COLACE) 100 MG capsule Take 100 mg by mouth 2 (two) times daily.   FINASTERIDE PO Take by mouth.   furosemide (LASIX) 40 MG tablet Take 40 mg by mouth daily.   pravastatin (PRAVACHOL) 40 MG tablet TAKE ONE TABLET BY MOUTH AT BEDTIME FOR CHOLESTEROL TO REPLACE ATORVASTATIN   tamsulosin (FLOMAX) 0.4 MG CAPS capsule Take 0.8 mg by mouth daily.    traZODone (DESYREL) 150 MG tablet Take by mouth at bedtime as needed for sleep.   aMILoride (MIDAMOR) 5 MG tablet Take 10 mg by mouth daily. (Patient not taking: Reported on 07/30/2023)   amLODipine (NORVASC) 5 MG tablet Take 10 mg by mouth in the morning and at bedtime. (Patient not taking: Reported on 07/30/2023)   Calcium Polycarbophil (FIBER) 625 MG TABS Take by mouth. (Patient not taking: Reported on 07/30/2023)   hydrALAZINE (APRESOLINE) 100 MG tablet Take 1 tablet by mouth 2 (two) times daily. (Patient not taking: Reported on 07/30/2023)   Omega-3 Fatty Acids (FISH OIL PO) Take 1 capsule by mouth 2 (two) times daily.  (Patient not taking: Reported on 07/30/2023)   No facility-administered encounter medications on file as of 07/30/2023.    Allergies (verified) Ketorolac tromethamine and Nsaids  History: Past Medical History:  Diagnosis Date   Arthritis    Benign prostatic hypertrophy    Chronic kidney disease    Depression    With psychotic features   Hypertension    Past Surgical History:  Procedure Laterality Date   A/V FISTULAGRAM Left 03/18/2022   Procedure: A/V Fistulagram;  Surgeon: Annice Needy, MD;  Location: [facility] INVASIVE CV LAB;  Service: Cardiovascular;  Laterality: Left;   AV FISTULA PLACEMENT Left    COLONOSCOPY WITH PROPOFOL N/A 06/05/2021   Procedure: COLONOSCOPY WITH PROPOFOL;  Surgeon: Midge Minium, MD;  Location:  [facility] ENDOSCOPY;  Service: Endoscopy;  Laterality: N/A;   CYST EXCISION Left 11/06/2013   ganglion cyst removed left hand   CYST EXCISION     Family History  Problem Relation Age of Onset   Hypertension Mother    Arthritis Father    Hypertension Father    Cancer Father        prostate cancer   Stroke Father    Diabetes Brother    Social History   Socioeconomic History   Marital status: Married    Spouse name: Not on file   Number of children: Not on file   Years of education: Not on file   Highest education level: Not on file  Occupational History   Not on file  Tobacco Use   Smoking status: Former    Current packs/day: 0.00    Average packs/day: 2.0 packs/day for 15.0 years (30.0 ttl pk-yrs)    Types: Cigarettes    Start date: 09/23/1973    Quit date: 09/23/1988    Years since quitting: 34.8   Smokeless tobacco: Never  Vaping Use   Vaping status: Never Used  Substance and Sexual Activity   Alcohol use: No    Alcohol/week: 0.0 standard drinks of alcohol   Drug use: No   Sexual activity: Yes  Other Topics Concern   Not on file  Social History Narrative   married   Social Determinants of Health   Financial Resource Strain: Low Risk  (07/30/2023)   Overall Financial Resource Strain (CARDIA)    Difficulty of Paying Living Expenses: Not hard at all  Food Insecurity: No Food Insecurity (07/30/2023)   Hunger Vital Sign    Worried About Running Out of Food in the Last Year: Never true    Ran Out of Food in the Last Year: Never true  Transportation Needs: No Transportation Needs (07/30/2023)   PRAPARE - Administrator, Civil Service (Medical): No    Lack of Transportation (Non-Medical): No  Physical Activity: Inactive (07/30/2023)   Exercise Vital Sign    Days of Exercise per Week: 0 days    Minutes of Exercise per Session: 0 min  Stress: No Stress Concern Present (07/30/2023)   [employer] of Occupational Health - Occupational Stress Questionnaire     Feeling of Stress : Not at all  Social Connections: Moderately Integrated (07/30/2023)   Social Connection and Isolation Panel [NHANES]    Frequency of Communication with Friends and Family: More than three times a week    Frequency of Social Gatherings with Friends and Family: More than three times a week    Attends Religious Services: More than 4 times per year    Active Member of [employer] or Organizations: No    Attends Banker Meetings: Never    Marital Status: Married    Tobacco Counseling Counseling given: Not Answered   Clinical Intake:  Pre-visit  preparation completed: Yes  Pain : No/denies pain     BMI - recorded: 30.54 Nutritional Status: BMI > 30  Obese Nutritional Risks: None Diabetes: No  How often do you need to have someone help you when you read instructions, pamphlets, or other written materials from your doctor or pharmacy?: 1 - Never  Interpreter Needed?: No  Information entered by :: R. Glennie Rodda LPN   Activities of Daily Living    07/30/2023   12:52 PM  In your present state of health, do you have any difficulty performing the following activities:  Hearing? 0  Vision? 0  Comment readers  Difficulty concentrating or making decisions? 0  Walking or climbing stairs? 0  Dressing or bathing? 0  Doing errands, shopping? 0  Preparing Food and eating ? N  Using the Toilet? N  In the past six months, have you accidently leaked urine? N  Do you have problems with loss of bowel control? N  Managing your Medications? N  Managing your Finances? N  Housekeeping or managing your Housekeeping? N    Patient Care Team: Dale [place], MD as PCP - General (Internal Medicine)  Indicate any recent Medical Services you may have received from other than [facility] providers in the past year (date may be approximate).     Assessment:   This is a routine wellness examination for [place].  Hearing/Vision screen Hearing Screening - Comments:: No  issues Vision Screening - Comments:: readers   Goals Addressed             This Visit's Progress    Patient Stated       Wants to start back walking       Depression Screen    07/30/2023    1:01 PM 02/05/2023   12:11 PM 07/31/2022   10:11 AM 07/15/2022    2:35 PM 12/13/2021    9:57 AM 07/12/2021    9:55 AM 06/14/2021    9:59 AM  PHQ 2/9 Scores  PHQ - 2 Score 0 0 0 0 0 0 0  PHQ- 9 Score 0          Fall Risk    07/30/2023   12:54 PM 02/05/2023   12:11 PM 07/31/2022   10:10 AM 07/15/2022    2:35 PM 12/13/2021    9:57 AM  Fall Risk   Falls in the past year? 0 0 0 0 0  Number falls in past yr: 0 0 0 0   Injury with Fall? 0 0 0 0   Risk for fall due to : No Fall Risks No Fall Risks No Fall Risks Impaired balance/gait No Fall Risks  Follow up Falls prevention discussed;Falls evaluation completed Falls evaluation completed Falls evaluation completed  Falls evaluation completed    MEDICARE RISK AT HOME: Medicare Risk at Home Any stairs in or around the home?: Yes If so, are there any without handrails?: No Home free of loose throw rugs in walkways, pet beds, electrical cords, etc?: Yes Adequate lighting in your home to reduce risk of falls?: Yes Life alert?: No Use of a cane, walker or w/c?: No Grab bars in the bathroom?: No Shower chair or bench in shower?: Yes Elevated toilet seat or a handicapped toilet?: No   Cognitive Function:    02/21/2017    9:33 AM  MMSE - Mini Mental State Exam  Orientation to time 5  Orientation to Place 5  Registration 3  Attention/ Calculation 5  Recall 3  Language- name 2  objects 2  Language- repeat 1  Language- follow 3 step command 3  Language- read & follow direction 1  Write a sentence 1  Copy design 1  Total score 30        07/30/2023    1:06 PM 07/15/2022    2:46 PM 07/12/2021    9:56 AM  6CIT Screen  What Year? 0 points 0 points 0 points  What month? 0 points 0 points 0 points  What time? 0 points 0 points 0 points   Count back from 20 0 points 0 points 0 points  Months in reverse 0 points 0 points 0 points  Repeat phrase 2 points 0 points 0 points  Total Score 2 points 0 points 0 points    Immunizations Immunization History  Administered Date(s) Administered   Hepatitis A, Adult 04/17/2022   Hepatitis B, ADULT 05/11/2020, 06/12/2020   Influenza,inj,Quad PF,6+ Mos 07/17/2022   Influenza-Unspecified 07/18/1995   Moderna Covid-19 Fall Seasonal Vaccine 68yrs & older 07/23/2023   PFIZER(Purple Top)SARS-COV-2 Vaccination 04/24/2020, 05/15/2020   PNEUMOCOCCAL CONJUGATE-20 05/20/2022   Pneumococcal-Unspecified 02/26/2008   Tdap 06/29/2013   Zoster Recombinant(Shingrix) 04/17/2022, 06/18/2022    TDAP status: Due, Education has been provided regarding the importance of this vaccine. Advised may receive this vaccine at local pharmacy or Health Dept. Aware to provide a copy of the vaccination record if obtained from local pharmacy or Health Dept. Verbalized acceptance and understanding.  Flu Vaccine status: Up to date per patient  Pneumococcal vaccine status: Up to date  Covid-19 vaccine status: Completed vaccines  Qualifies for Shingles Vaccine? Yes   Zostavax completed No   Shingrix Completed?: Yes  Screening Tests Health Maintenance  Topic Date Due   HIV Screening  Never done   DTaP/Tdap/Td (2 - Td or Tdap) 06/30/2023   Medicare Annual Wellness (AWV)  07/16/2023   COVID-19 Vaccine (4 - 2023-24 season) 09/17/2023   Colonoscopy  06/06/2031   INFLUENZA VACCINE  Completed   Hepatitis C Screening  Completed   Zoster Vaccines- Shingrix  Completed   HPV VACCINES  Aged Out    Health Maintenance  Health Maintenance Due  Topic Date Due   HIV Screening  Never done   DTaP/Tdap/Td (2 - Td or Tdap) 06/30/2023   Medicare Annual Wellness (AWV)  07/16/2023    Colorectal cancer screening: Type of screening: Colonoscopy. Completed 05/2021. Repeat every 10 years  Lung Cancer Screening: (Low Dose CT  Chest recommended if Age 42-80 years, 20 pack-year currently smoking OR have quit w/in 15years.) does not qualify.     Additional Screening:  Hepatitis C Screening: does qualify; Completed 06/2021  Vision Screening: Recommended annual ophthalmology exams for early detection of glaucoma and other disorders of the eye. Is the patient up to date with their annual eye exam?  Yes  Who is the provider or what is the name of the office in which the patient attends annual eye exams? [facility] If pt is not established with a provider, would they like to be referred to a provider to establish care? No .   Dental Screening: Recommended annual dental exams for proper oral hygiene    Community Resource Referral / Chronic Care Management: CRR required this visit?  No   CCM required this visit?  No     Plan:     I have personally reviewed and noted the following in the patient's chart:   Medical and social history Use of alcohol, tobacco or illicit drugs  Current medications  and supplements including opioid prescriptions. Patient is not currently taking opioid prescriptions. Functional ability and status Nutritional status Physical activity Advanced directives List of other physicians Hospitalizations, surgeries, and ER visits in previous 12 months Vitals Screenings to include cognitive, depression, and falls Referrals and appointments  In addition, I have reviewed and discussed with patient certain preventive protocols, quality metrics, and best practice recommendations. A written personalized care plan for preventive services as well as general preventive health recommendations were provided to patient.     Sydell Axon, LPN   05/31/1190   After Visit Summary: (Declined) Due to this being a telephonic visit, with patients personalized plan was offered to patient but patient Declined AVS at this time   Nurse Notes: None

## 2023-09-23 DIAGNOSIS — N186 End stage renal disease: Secondary | ICD-10-CM | POA: Diagnosis not present

## 2023-09-23 DIAGNOSIS — I158 Other secondary hypertension: Secondary | ICD-10-CM | POA: Diagnosis not present

## 2023-09-23 DIAGNOSIS — Z992 Dependence on renal dialysis: Secondary | ICD-10-CM | POA: Diagnosis not present

## 2023-10-31 DIAGNOSIS — Z01818 Encounter for other preprocedural examination: Secondary | ICD-10-CM | POA: Diagnosis not present

## 2023-11-21 DIAGNOSIS — N186 End stage renal disease: Secondary | ICD-10-CM | POA: Diagnosis not present

## 2023-11-21 DIAGNOSIS — I158 Other secondary hypertension: Secondary | ICD-10-CM | POA: Diagnosis not present

## 2023-11-21 DIAGNOSIS — Z992 Dependence on renal dialysis: Secondary | ICD-10-CM | POA: Diagnosis not present

## 2023-12-24 DIAGNOSIS — L638 Other alopecia areata: Secondary | ICD-10-CM | POA: Diagnosis not present

## 2024-01-05 DIAGNOSIS — Z7682 Awaiting organ transplant status: Secondary | ICD-10-CM | POA: Diagnosis not present

## 2024-01-21 DIAGNOSIS — N186 End stage renal disease: Secondary | ICD-10-CM | POA: Diagnosis not present

## 2024-01-21 DIAGNOSIS — I158 Other secondary hypertension: Secondary | ICD-10-CM | POA: Diagnosis not present

## 2024-01-21 DIAGNOSIS — Z992 Dependence on renal dialysis: Secondary | ICD-10-CM | POA: Diagnosis not present

## 2024-02-09 ENCOUNTER — Encounter: Payer: Medicare Other | Admitting: Internal Medicine

## 2024-03-01 DIAGNOSIS — Z7682 Awaiting organ transplant status: Secondary | ICD-10-CM | POA: Diagnosis not present

## 2024-03-08 DIAGNOSIS — Z7682 Awaiting organ transplant status: Secondary | ICD-10-CM | POA: Diagnosis not present

## 2024-03-15 ENCOUNTER — Ambulatory Visit (INDEPENDENT_AMBULATORY_CARE_PROVIDER_SITE_OTHER): Admitting: Internal Medicine

## 2024-03-15 VITALS — BP 110/70 | HR 69 | Temp 97.9°F | Resp 16 | Ht 67.0 in | Wt 193.6 lb

## 2024-03-15 DIAGNOSIS — F319 Bipolar disorder, unspecified: Secondary | ICD-10-CM

## 2024-03-15 DIAGNOSIS — Z1211 Encounter for screening for malignant neoplasm of colon: Secondary | ICD-10-CM

## 2024-03-15 DIAGNOSIS — D649 Anemia, unspecified: Secondary | ICD-10-CM | POA: Diagnosis not present

## 2024-03-15 DIAGNOSIS — N185 Chronic kidney disease, stage 5: Secondary | ICD-10-CM

## 2024-03-15 DIAGNOSIS — D472 Monoclonal gammopathy: Secondary | ICD-10-CM | POA: Diagnosis not present

## 2024-03-15 DIAGNOSIS — I10 Essential (primary) hypertension: Secondary | ICD-10-CM

## 2024-03-15 DIAGNOSIS — Z0000 Encounter for general adult medical examination without abnormal findings: Secondary | ICD-10-CM | POA: Diagnosis not present

## 2024-03-15 DIAGNOSIS — E7800 Pure hypercholesterolemia, unspecified: Secondary | ICD-10-CM | POA: Diagnosis not present

## 2024-03-15 DIAGNOSIS — F32A Depression, unspecified: Secondary | ICD-10-CM

## 2024-03-15 NOTE — Progress Notes (Signed)
 Subjective:    Patient ID: Donald Small, male    DOB: 1961/03/13, 63 y.o.   MRN: [id number]  Patient here for  Chief Complaint  Patient presents with   Annual Exam    HPI Here for a physical exam. Reports he is doing relatively well. Dialysis T,Th and Saturday. Overall tolerating dialysis better now. Recently had his dry weight increased. This has helped. Also, his blood pressure medication has been adjusted. Also, he does not take his blood pressure medication on dialysis days. No chest pain. Breathing stable. No increased cough or congestion. No abdominal pain or bowel change reported.    Past Medical History:  Diagnosis Date   Arthritis    Benign prostatic hypertrophy    Chronic kidney disease    Depression    With psychotic features   Hypertension    Past Surgical History:  Procedure Laterality Date   A/V FISTULAGRAM Left 03/18/2022   Procedure: A/V Fistulagram;  Surgeon: Marea Selinda RAMAN, MD;  Location: [facility] INVASIVE CV LAB;  Service: Cardiovascular;  Laterality: Left;   AV FISTULA PLACEMENT Left    COLONOSCOPY WITH PROPOFOL  N/A 06/05/2021   Procedure: COLONOSCOPY WITH PROPOFOL ;  Surgeon: Jinny Carmine, MD;  Location: [facility] ENDOSCOPY;  Service: Endoscopy;  Laterality: N/A;   CYST EXCISION Left 11/06/2013   ganglion cyst removed left hand   CYST EXCISION     Family History  Problem Relation Age of Onset   Hypertension Mother    Arthritis Father    Hypertension Father    Cancer Father        prostate cancer   Stroke Father    Diabetes Brother    Social History   Socioeconomic History   Marital status: Married    Spouse name: Not on file   Number of children: Not on file   Years of education: Not on file   Highest education level: Not on file  Occupational History   Not on file  Tobacco Use   Smoking status: Former    Current packs/day: 0.00    Average packs/day: 2.0 packs/day for 15.0 years (30.0 ttl pk-yrs)    Types: Cigarettes    Start date:  09/23/1973    Quit date: 09/23/1988    Years since quitting: 35.5   Smokeless tobacco: Never  Vaping Use   Vaping status: Never Used  Substance and Sexual Activity   Alcohol use: No    Alcohol/week: 0.0 standard drinks of alcohol   Drug use: No   Sexual activity: Yes  Other Topics Concern   Not on file  Social History Narrative   married   Social Drivers of Corporate investment banker Strain: Low Risk  (07/30/2023)   Overall Financial Resource Strain (CARDIA)    Difficulty of Paying Living Expenses: Not hard at all  Food Insecurity: No Food Insecurity (07/30/2023)   Hunger Vital Sign    Worried About Running Out of Food in the Last Year: Never true    Ran Out of Food in the Last Year: Never true  Transportation Needs: No Transportation Needs (07/30/2023)   PRAPARE - Administrator, Civil Service (Medical): No    Lack of Transportation (Non-Medical): No  Physical Activity: Inactive (07/30/2023)   Exercise Vital Sign    Days of Exercise per Week: 0 days    Minutes of Exercise per Session: 0 min  Stress: No Stress Concern Present (07/30/2023)   [employer] of Occupational Health - Occupational Stress Questionnaire  Feeling of Stress : Not at all  Social Connections: Moderately Integrated (07/30/2023)   Social Connection and Isolation Panel    Frequency of Communication with Friends and Family: More than three times a week    Frequency of Social Gatherings with Friends and Family: More than three times a week    Attends Religious Services: More than 4 times per year    Active Member of [employer] or Organizations: No    Attends Banker Meetings: Never    Marital Status: Married     Review of Systems  Constitutional:  Negative for appetite change and unexpected weight change.  HENT:  Negative for congestion, sinus pressure and sore throat.   Eyes:  Negative for pain and visual disturbance.  Respiratory:  Negative for cough, chest tightness and shortness  of breath.   Cardiovascular:  Negative for chest pain, palpitations and leg swelling.  Gastrointestinal:  Negative for abdominal pain, diarrhea, nausea and vomiting.  Genitourinary:  Negative for difficulty urinating and dysuria.  Musculoskeletal:  Negative for joint swelling and myalgias.  Skin:  Negative for color change and rash.  Neurological:  Negative for dizziness and headaches.  Hematological:  Negative for adenopathy. Does not bruise/bleed easily.  Psychiatric/Behavioral:  Negative for agitation and dysphoric mood.        Objective:     BP 110/70   Pulse 69   Temp 97.9 F (36.6 C)   Resp 16   Ht 5' 7 (1.702 m)   Wt 193 lb 9.6 oz (87.8 kg)   SpO2 98%   BMI 30.32 kg/m  Wt Readings from Last 3 Encounters:  03/15/24 193 lb 9.6 oz (87.8 kg)  07/30/23 195 lb (88.5 kg)  02/05/23 195 lb 12.8 oz (88.8 kg)    Physical Exam Constitutional:      General: He is not in acute distress.    Appearance: Normal appearance. He is well-developed.  HENT:     Head: Normocephalic and atraumatic.     Right Ear: External ear normal.     Left Ear: External ear normal.     Mouth/Throat:     Pharynx: No oropharyngeal exudate or posterior oropharyngeal erythema.   Eyes:     General: No scleral icterus.       Right eye: No discharge.        Left eye: No discharge.     Conjunctiva/sclera: Conjunctivae normal.   Neck:     Thyroid : No thyromegaly.   Cardiovascular:     Rate and Rhythm: Normal rate and regular rhythm.  Pulmonary:     Effort: No respiratory distress.     Breath sounds: Normal breath sounds. No wheezing.  Abdominal:     General: Bowel sounds are normal.     Palpations: Abdomen is soft.     Tenderness: There is no abdominal tenderness.   Musculoskeletal:        General: No swelling or tenderness.     Cervical back: Neck supple. No tenderness.  Lymphadenopathy:     Cervical: No cervical adenopathy.   Skin:    Findings: No erythema or rash.   Neurological:      Mental Status: He is alert and oriented to person, place, and time.   Psychiatric:        Mood and Affect: Mood normal.        Behavior: Behavior normal.         Outpatient Encounter Medications as of 03/15/2024  Medication Sig   furosemide (LASIX)  40 MG tablet Take 40 mg by mouth daily. (Patient taking differently: Take 80 mg by mouth daily.)   ascorbic acid (VITAMIN C) 1000 MG tablet Take by mouth.   Calcium Citrate-Vitamin D (CALCIUM + D PO) Take by mouth.   carvedilol  (COREG ) 12.5 MG tablet Take 12.5 mg by mouth 2 (two) times daily with a meal.   docusate sodium  (COLACE) 100 MG capsule Take 100 mg by mouth 2 (two) times daily.   FINASTERIDE PO Take by mouth.   pravastatin (PRAVACHOL) 40 MG tablet TAKE ONE TABLET BY MOUTH AT BEDTIME FOR CHOLESTEROL TO REPLACE ATORVASTATIN   tamsulosin  (FLOMAX ) 0.4 MG CAPS capsule Take 0.8 mg by mouth daily.    traZODone (DESYREL) 150 MG tablet Take by mouth at bedtime as needed for sleep.   [DISCONTINUED] aMILoride  (MIDAMOR ) 5 MG tablet Take 10 mg by mouth daily. (Patient not taking: Reported on 07/30/2023)   [DISCONTINUED] amLODipine  (NORVASC ) 5 MG tablet Take 10 mg by mouth in the morning and at bedtime. (Patient not taking: Reported on 07/30/2023)   [DISCONTINUED] Calcium Polycarbophil (FIBER) 625 MG TABS Take by mouth. (Patient not taking: Reported on 07/30/2023)   [DISCONTINUED] hydrALAZINE (APRESOLINE) 100 MG tablet Take 1 tablet by mouth 2 (two) times daily. (Patient not taking: Reported on 07/30/2023)   [DISCONTINUED] Omega-3 Fatty Acids (FISH OIL PO) Take 1 capsule by mouth 2 (two) times daily.  (Patient not taking: Reported on 07/30/2023)   No facility-administered encounter medications on file as of 03/15/2024.     Lab Results  Component Value Date   WBC 5.9 06/19/2021   HGB 10.9 (A) 06/19/2021   HCT 31 (A) 06/19/2021   PLT 186 06/19/2021   GLUCOSE 88 04/19/2015   CHOL 99 03/15/2024   TRIG 139.0 03/15/2024   HDL 31.40 (L) 03/15/2024    LDLCALC 40 03/15/2024   ALT 17 03/15/2024   AST 12 03/15/2024   NA 140 06/19/2021   K 4.0 06/19/2021   CL 108 06/19/2021   CREATININE 6.9 (A) 06/19/2021   BUN 80 (A) 06/19/2021   CO2 25 (A) 06/19/2021   TSH 1.61 03/15/2024   HGBA1C 5.7 06/19/2021    PERIPHERAL VASCULAR CATHETERIZATION Result Date: 03/18/2022 See surgical note for result.      Assessment & Plan:  Essential hypertension, benign Assessment & Plan: Blood pressure medications has been adjusted. Currently on carvedilol . Holds on day of dialysis. Blood presssure doing well. Follow. Metabolic panel being followed by nephrology.    Hypercholesterolemia Assessment & Plan: Low cholesterol diet and exercise.  Continue pravastatin.  Check lipid panel and liver function tests.    Orders: -     Hepatic function panel -     TSH -     Lipid panel  Anemia, unspecified type Assessment & Plan: Has been followed by hematology. Baseline hgb has previously been around 10.  Obtain CBC from dialysis.    Bipolar affective disorder, remission status unspecified (HCC) Assessment & Plan: Has been followed by psychiatry. Stable.    CKD (chronic kidney disease) stage 5, GFR less than 15 ml/min (HCC) Assessment & Plan: Currently receiving dialysis as outlined. Recently had blood pressure medication adjusted and dry weight increased. Doing better. Currently on transplant list.    Colon cancer screening Assessment & Plan: Colonoscopy 05/2021.    Health care maintenance Assessment & Plan: 03/29/2021 10:38 SERUM HEPATITIS C ANTIBNegative Ref: Negative. Prostates checks through [place].  colonoscopy 06/05/21 - diverticulosis and non bleeding internal hemorrhoids.    MGUS (monoclonal  gammopathy of unknown significance) Assessment & Plan: Followed by hematology.    Mild depression Assessment & Plan: Has been followed by psychiatry.       Allena Hamilton, MD

## 2024-03-16 LAB — HEPATIC FUNCTION PANEL
ALT: 17 U/L (ref 0–53)
AST: 12 U/L (ref 0–37)
Albumin: 4 g/dL (ref 3.5–5.2)
Alkaline Phosphatase: 118 U/L — ABNORMAL HIGH (ref 39–117)
Bilirubin, Direct: 0 mg/dL (ref 0.0–0.3)
Total Bilirubin: 0.3 mg/dL (ref 0.2–1.2)
Total Protein: 7.5 g/dL (ref 6.0–8.3)

## 2024-03-16 LAB — LIPID PANEL
Cholesterol: 99 mg/dL (ref 0–200)
HDL: 31.4 mg/dL — ABNORMAL LOW (ref >39.00)
LDL Cholesterol: 40 mg/dL (ref 0–99)
NonHDL: 67.9
Total CHOL/HDL Ratio: 3
Triglycerides: 139 mg/dL (ref 0.0–149.0)
VLDL: 27.8 mg/dL (ref 0.0–40.0)

## 2024-03-16 LAB — TSH: TSH: 1.61 u[IU]/mL (ref 0.35–5.50)

## 2024-03-17 ENCOUNTER — Ambulatory Visit: Payer: Self-pay | Admitting: Internal Medicine

## 2024-03-20 ENCOUNTER — Encounter: Payer: Self-pay | Admitting: Internal Medicine

## 2024-03-20 NOTE — Assessment & Plan Note (Signed)
 Has been followed by hematology. Baseline hgb has previously been around 10.  Obtain CBC from dialysis.

## 2024-03-20 NOTE — Assessment & Plan Note (Signed)
 Followed by hematology

## 2024-03-20 NOTE — Assessment & Plan Note (Signed)
 Currently receiving dialysis as outlined. Recently had blood pressure medication adjusted and dry weight increased. Doing better. Currently on transplant list.

## 2024-03-20 NOTE — Assessment & Plan Note (Signed)
Has been followed by psychiatry.

## 2024-03-20 NOTE — Assessment & Plan Note (Signed)
Has been followed by psychiatry.  Stable.   

## 2024-03-20 NOTE — Assessment & Plan Note (Signed)
 Blood pressure medications has been adjusted. Currently on carvedilol . Holds on day of dialysis. Blood presssure doing well. Follow. Metabolic panel being followed by nephrology.

## 2024-03-20 NOTE — Assessment & Plan Note (Signed)
 Low cholesterol diet and exercise.  Continue pravastatin.  Check lipid panel and liver function tests.

## 2024-03-20 NOTE — Assessment & Plan Note (Signed)
Colonoscopy 05/2021.  

## 2024-03-20 NOTE — Assessment & Plan Note (Signed)
 03/29/2021 10:38 SERUM HEPATITIS C ANTIBNegative Ref: Negative. Prostates checks through [place].  colonoscopy 06/05/21 - diverticulosis and non bleeding internal hemorrhoids.

## 2024-03-22 DIAGNOSIS — I158 Other secondary hypertension: Secondary | ICD-10-CM | POA: Diagnosis not present

## 2024-03-22 DIAGNOSIS — Z992 Dependence on renal dialysis: Secondary | ICD-10-CM | POA: Diagnosis not present

## 2024-03-22 DIAGNOSIS — N186 End stage renal disease: Secondary | ICD-10-CM | POA: Diagnosis not present

## 2024-04-05 DIAGNOSIS — Z992 Dependence on renal dialysis: Secondary | ICD-10-CM | POA: Diagnosis not present

## 2024-04-05 DIAGNOSIS — N186 End stage renal disease: Secondary | ICD-10-CM | POA: Diagnosis not present

## 2024-04-05 DIAGNOSIS — T82868A Thrombosis of vascular prosthetic devices, implants and grafts, initial encounter: Secondary | ICD-10-CM | POA: Diagnosis not present

## 2024-04-22 DIAGNOSIS — Z992 Dependence on renal dialysis: Secondary | ICD-10-CM | POA: Diagnosis not present

## 2024-04-22 DIAGNOSIS — N186 End stage renal disease: Secondary | ICD-10-CM | POA: Diagnosis not present

## 2024-04-22 DIAGNOSIS — I158 Other secondary hypertension: Secondary | ICD-10-CM | POA: Diagnosis not present

## 2024-05-04 DIAGNOSIS — Z992 Dependence on renal dialysis: Secondary | ICD-10-CM | POA: Diagnosis not present

## 2024-05-04 DIAGNOSIS — N186 End stage renal disease: Secondary | ICD-10-CM | POA: Diagnosis not present

## 2024-05-04 DIAGNOSIS — N189 Chronic kidney disease, unspecified: Secondary | ICD-10-CM | POA: Diagnosis not present

## 2024-05-21 DIAGNOSIS — E785 Hyperlipidemia, unspecified: Secondary | ICD-10-CM | POA: Diagnosis not present

## 2024-05-21 DIAGNOSIS — Z01818 Encounter for other preprocedural examination: Secondary | ICD-10-CM | POA: Diagnosis not present

## 2024-05-21 DIAGNOSIS — N186 End stage renal disease: Secondary | ICD-10-CM | POA: Diagnosis not present

## 2024-05-21 DIAGNOSIS — I10 Essential (primary) hypertension: Secondary | ICD-10-CM | POA: Diagnosis not present

## 2024-05-23 DIAGNOSIS — Z992 Dependence on renal dialysis: Secondary | ICD-10-CM | POA: Diagnosis not present

## 2024-05-23 DIAGNOSIS — I158 Other secondary hypertension: Secondary | ICD-10-CM | POA: Diagnosis not present

## 2024-05-23 DIAGNOSIS — N186 End stage renal disease: Secondary | ICD-10-CM | POA: Diagnosis not present

## 2024-06-09 ENCOUNTER — Encounter: Payer: Self-pay | Admitting: Internal Medicine

## 2024-06-09 DIAGNOSIS — Z888 Allergy status to other drugs, medicaments and biological substances status: Secondary | ICD-10-CM | POA: Diagnosis not present

## 2024-06-09 DIAGNOSIS — E785 Hyperlipidemia, unspecified: Secondary | ICD-10-CM | POA: Diagnosis not present

## 2024-06-09 DIAGNOSIS — Z992 Dependence on renal dialysis: Secondary | ICD-10-CM | POA: Diagnosis not present

## 2024-06-09 DIAGNOSIS — D631 Anemia in chronic kidney disease: Secondary | ICD-10-CM | POA: Diagnosis not present

## 2024-06-09 DIAGNOSIS — N186 End stage renal disease: Secondary | ICD-10-CM | POA: Diagnosis not present

## 2024-06-09 DIAGNOSIS — Z79899 Other long term (current) drug therapy: Secondary | ICD-10-CM | POA: Diagnosis not present

## 2024-06-09 DIAGNOSIS — G8918 Other acute postprocedural pain: Secondary | ICD-10-CM | POA: Diagnosis not present

## 2024-06-09 DIAGNOSIS — I120 Hypertensive chronic kidney disease with stage 5 chronic kidney disease or end stage renal disease: Secondary | ICD-10-CM | POA: Diagnosis not present

## 2024-06-09 DIAGNOSIS — Z886 Allergy status to analgesic agent status: Secondary | ICD-10-CM | POA: Diagnosis not present

## 2024-06-09 DIAGNOSIS — T82898A Other specified complication of vascular prosthetic devices, implants and grafts, initial encounter: Secondary | ICD-10-CM | POA: Diagnosis not present

## 2024-06-09 DIAGNOSIS — G709 Myoneural disorder, unspecified: Secondary | ICD-10-CM | POA: Diagnosis not present

## 2024-10-19 ENCOUNTER — Ambulatory Visit: Admitting: *Deleted

## 2024-10-19 VITALS — BP 126/80 | Ht 67.0 in | Wt 190.0 lb

## 2024-10-19 DIAGNOSIS — Z0000 Encounter for general adult medical examination without abnormal findings: Secondary | ICD-10-CM | POA: Diagnosis not present

## 2024-10-19 NOTE — Patient Instructions (Signed)
 [name],  Thank you for taking the time for your Medicare Wellness Visit. I appreciate your continued commitment to your health goals. Please review the care plan we discussed, and feel free to reach out if I can assist you further.  Please note that Annual Wellness Visits do not include a physical exam. Some assessments may be limited, especially if the visit was conducted virtually. If needed, we may recommend an in-person follow-up with your provider.  Ongoing Care Seeing your primary care provider every 3 to 6 months helps us  monitor your health and provide consistent, personalized care.  Remember to check on your Hepatitis B vaccines when you go for your appointment at the [facility].   Referrals If a referral was made during today's visit and you haven't received any updates within two weeks, please contact the referred provider directly to check on the status.  Recommended Screenings:  Health Maintenance  Topic Date Due   HIV Screening  Never done   Hepatitis B Vaccine (3 of 3 - 19+ 3-dose series) 11/11/2020   COVID-19 Vaccine (5 - Mixed Product risk 2025-26 season) 02/02/2025   Medicare Annual Wellness Visit  10/19/2025   Colon Cancer Screening  06/06/2031   DTaP/Tdap/Td vaccine (3 - Td or Tdap) 06/15/2033   Pneumococcal Vaccine for age over 72  Completed   Flu Shot  Completed   HPV Vaccine (No Doses Required) Completed   Hepatitis C Screening  Completed   Zoster (Shingles) Vaccine  Completed   Meningitis B Vaccine  Aged Out       10/19/2024    1:48 PM  Advanced Directives  Does Patient Have a Medical Advance Directive? Yes  Type of Estate Agent of [place];Living will  Does patient want to make changes to medical advance directive? No - Patient declined  Copy of Healthcare Power of Attorney in Chart? No - copy requested    Vision: Annual vision screenings are recommended for early detection of glaucoma, cataracts, and diabetic retinopathy. These exams  can also reveal signs of chronic conditions such as diabetes and high blood pressure.  Dental: Annual dental screenings help detect early signs of oral cancer, gum disease, and other conditions linked to overall health, including heart disease and diabetes.  Please see the attached documents for additional preventive care recommendations.

## 2024-10-19 NOTE — Progress Notes (Signed)
 "  Chief Complaint  Patient presents with   Medicare Wellness     Subjective:   Donald Small is a 64 y.o. male who presents for a Medicare Annual Wellness Visit.  Visit info / Clinical Intake: Medicare Wellness Visit Type:: Subsequent Annual Wellness Visit Persons participating in visit and providing information:: patient Medicare Wellness Visit Mode:: Telephone If telephone:: video declined Since this visit was completed virtually, some vitals may be partially provided or unavailable. Missing vitals are due to the limitations of the virtual format.: Documented vitals are patient reported If Telephone or Video please confirm:: I connected with patient using audio/video enable telemedicine. I verified patient identity with two identifiers, discussed telehealth limitations, and patient agreed to proceed. Patient Location:: Home Provider Location:: Office/Home Interpreter Needed?: No Pre-visit prep was completed: yes AWV questionnaire completed by patient prior to visit?: no Living arrangements:: lives with spouse/significant other Patient's Overall Health Status Rating: good Typical amount of pain: none Does pain affect daily life?: no Are you currently prescribed opioids?: no  Dietary Habits and Nutritional Risks How many meals a day?: 3 Eats fruit and vegetables daily?: yes Most meals are obtained by: preparing own meals In the last 2 weeks, have you had any of the following?: none Diabetic:: no  Functional Status Activities of Daily Living (to include ambulation/medication): Independent Ambulation: Independent Medication Administration: Independent Home Management (perform basic housework or laundry): Independent Manage your own finances?: yes Primary transportation is: driving Concerns about vision?: no *vision screening is required for WTM* Concerns about hearing?: no  Fall Screening Falls in the past year?: 0 Number of falls in past year: 0 Was there an  injury with Fall?: 0 Fall Risk Category Calculator: 0 Patient Fall Risk Level: Low Fall Risk  Fall Risk Patient at Risk for Falls Due to: No Fall Risks Fall risk Follow up: Falls evaluation completed; Falls prevention discussed  Home and Transportation Safety: All rugs have non-skid backing?: N/A, no rugs All stairs or steps have railings?: yes Grab bars in the bathtub or shower?: (!) no Have non-skid surface in bathtub or shower?: (!) no Good home lighting?: yes Regular seat belt use?: yes Hospital stays in the last year:: no  Cognitive Assessment Difficulty concentrating, remembering, or making decisions? : no Will 6CIT or Mini Cog be Completed: yes What year is it?: 0 points What month is it?: 0 points Give patient an address phrase to remember (5 components): [address] [place] About what time is it?: 0 points Count backwards from 20 to 1: 0 points Say the months of the year in reverse: 0 points Repeat the address phrase from earlier: 0 points 6 CIT Score: 0 points  Advance Directives (For Healthcare) Does Patient Have a Medical Advance Directive?: Yes Does patient want to make changes to medical advance directive?: No - Patient declined Type of Advance Directive: Healthcare Power of [place]; Living will Copy of Healthcare Power of Attorney in Chart?: No - copy requested Copy of Living Will in Chart?: No - copy requested  Reviewed/Updated  Reviewed/Updated: Reviewed All (Medical, Surgical, Family, Medications, Allergies, Care Teams, Patient Goals)    Allergies (verified) Ketorolac tromethamine and Nsaids   Current Medications (verified) Outpatient Encounter Medications as of 10/19/2024  Medication Sig   ascorbic acid (VITAMIN C) 1000 MG tablet Take by mouth.   Calcium Citrate-Vitamin D (CALCIUM + D PO) Take by mouth.   carvedilol  (COREG ) 12.5 MG tablet Take 12.5 mg by mouth 2 (two) times daily with a  meal.   docusate sodium  (COLACE) 100 MG capsule Take  100 mg by mouth 2 (two) times daily. (Patient taking differently: Take 200 mg by mouth daily.)   furosemide (LASIX) 40 MG tablet Take 40 mg by mouth daily. (Patient taking differently: Take 80 mg by mouth daily.)   gabapentin (NEURONTIN) 100 MG capsule Take 100 mg by mouth daily.   OXcarbazepine (TRILEPTAL) 150 MG tablet Take 150 mg by mouth daily.   pravastatin (PRAVACHOL) 40 MG tablet TAKE ONE TABLET BY MOUTH AT BEDTIME FOR CHOLESTEROL TO REPLACE ATORVASTATIN   tamsulosin  (FLOMAX ) 0.4 MG CAPS capsule Take 0.8 mg by mouth daily.    traZODone (DESYREL) 150 MG tablet Take by mouth at bedtime as needed for sleep.   FINASTERIDE PO Take by mouth. (Patient not taking: Reported on 10/19/2024)   No facility-administered encounter medications on file as of 10/19/2024.    History: Past Medical History:  Diagnosis Date   Arthritis    Benign prostatic hypertrophy    Chronic kidney disease    Depression    With psychotic features   Hypertension    Past Surgical History:  Procedure Laterality Date   A/V FISTULAGRAM Left 03/18/2022   Procedure: A/V Fistulagram;  Surgeon: Marea Selinda RAMAN, MD;  Location: [facility] INVASIVE CV LAB;  Service: Cardiovascular;  Laterality: Left;   AV FISTULA PLACEMENT Left    COLONOSCOPY WITH PROPOFOL  N/A 06/05/2021   Procedure: COLONOSCOPY WITH PROPOFOL ;  Surgeon: Jinny Carmine, MD;  Location: [facility] ENDOSCOPY;  Service: Endoscopy;  Laterality: N/A;   CYST EXCISION Left 11/06/2013   ganglion cyst removed left hand   CYST EXCISION     Family History  Problem Relation Age of Onset   Hypertension Mother    Arthritis Father    Hypertension Father    Cancer Father        prostate cancer   Stroke Father    Diabetes Brother    Social History   Occupational History   Not on file  Tobacco Use   Smoking status: Former    Current packs/day: 0.00    Average packs/day: 2.0 packs/day for 15.0 years (30.0 ttl pk-yrs)    Types: Cigarettes    Start date: 09/23/1973    Quit date:  09/23/1988    Years since quitting: 36.0   Smokeless tobacco: Never  Vaping Use   Vaping status: Never Used  Substance and Sexual Activity   Alcohol use: No    Alcohol/week: 0.0 standard drinks of alcohol   Drug use: No   Sexual activity: Yes   Tobacco Counseling Counseling given: Not Answered  SDOH Screenings   Food Insecurity: No Food Insecurity (10/19/2024)  Housing: Low Risk (10/19/2024)  Transportation Needs: No Transportation Needs (10/19/2024)  Utilities: Not At Risk (10/19/2024)  Alcohol Screen: Low Risk (10/19/2024)  Depression (PHQ2-9): Low Risk (10/19/2024)  Financial Resource Strain: Low Risk (10/19/2024)  Physical Activity: Inactive (10/19/2024)  Social Connections: Socially Integrated (10/19/2024)  Stress: No Stress Concern Present (10/19/2024)  Tobacco Use: Medium Risk (10/19/2024)  Health Literacy: Adequate Health Literacy (10/19/2024)   See flowsheets for full screening details  Depression Screen PHQ 2 & 9 Depression Scale- Over the past 2 weeks, how often have you been bothered by any of the following problems? Little interest or pleasure in doing things: 0 Feeling down, depressed, or hopeless (PHQ Adolescent also includes...irritable): 0 PHQ-2 Total Score: 0 Trouble falling or staying asleep, or sleeping too much: 0 Feeling tired or having little energy: 0 Poor appetite or  overeating (PHQ Adolescent also includes...weight loss): 0 Feeling bad about yourself - or that you are a failure or have let yourself or your family down: 0 Trouble concentrating on things, such as reading the newspaper or watching television (PHQ Adolescent also includes...like school work): 0 Moving or speaking so slowly that other people could have noticed. Or the opposite - being so fidgety or restless that you have been moving around a lot more than usual: 0 Thoughts that you would be better off dead, or of hurting yourself in some way: 0 PHQ-9 Total Score: 0 If you checked off any problems,  how difficult have these problems made it for you to do your work, take care of things at home, or get along with other people?: Not difficult at all     Goals Addressed             This Visit's Progress    Patient Stated       Wants to exercise more             Objective:    Today's Vitals   10/19/24 1343  BP: 126/80  Weight: 190 lb (86.2 kg)  Height: 5' 7 (1.702 m)   Body mass index is 29.76 kg/m.  Hearing/Vision screen Hearing Screening - Comments:: No issues Vision Screening - Comments:: Readers, [facility], up to date Immunizations and Health Maintenance Health Maintenance  Topic Date Due   HIV Screening  Never done   Hepatitis B Vaccines 19-59 Average Risk (3 of 3 - 19+ 3-dose series) 11/11/2020   COVID-19 Vaccine (5 - Mixed Product risk 2025-26 season) 02/02/2025   Medicare Annual Wellness (AWV)  10/19/2025   Colonoscopy  06/06/2031   DTaP/Tdap/Td (3 - Td or Tdap) 06/15/2033   Pneumococcal Vaccine: 50+ Years  Completed   Influenza Vaccine  Completed   HPV VACCINES (No Doses Required) Completed   Hepatitis C Screening  Completed   Zoster Vaccines- Shingrix  Completed   Meningococcal B Vaccine  Aged Out        Assessment/Plan:  This is a routine wellness examination for Donald Small.  Patient Care Team: Glendia Shad, MD as PCP - General (Internal Medicine) Marchelle Kitchens, MD as Referring Physician (General Surgery) Orvis, Glendia Ditch, MD as Referring Physician (Internal Medicine) Teresia Space, MD as Referring Physician (Nephrology)  I have personally reviewed and noted the following in the patients chart:   Medical and social history Use of alcohol, tobacco or illicit drugs  Current medications and supplements including opioid prescriptions. Functional ability and status Nutritional status Physical activity Advanced directives List of other physicians Hospitalizations, surgeries, and ER visits in previous 12  months Vitals Screenings to include cognitive, depression, and falls Referrals and appointments  No orders of the defined types were placed in this encounter.  In addition, I have reviewed and discussed with patient certain preventive protocols, quality metrics, and best practice recommendations. A written personalized care plan for preventive services as well as general preventive health recommendations were provided to patient.   Angeline Fredericks, LPN   8/72/7973   Return in 1 year (on 10/19/2025).  After Visit Summary: (Pick Up) Due to this being a telephonic visit, with patients personalized plan was offered to patient and patient has requested to Pick up at office.  Nurse Notes: Patient thinks he completed the series of Hepatitis B vaccines at the [place], unable to find the last dose on [facility]. Patient has an upcoming appointment at [facility] and will check with them  and will update if needed. Patient stated that he is okay to have HIV screening done with next labs if PCP thinks that he really needs it. "

## 2025-03-17 ENCOUNTER — Encounter: Admitting: Internal Medicine

## 2025-03-18 ENCOUNTER — Encounter: Admitting: Internal Medicine

## 2025-10-25 ENCOUNTER — Ambulatory Visit
